# Patient Record
Sex: Male | Born: 1970 | Race: Black or African American | Hispanic: No | Marital: Married | Smoking: Never smoker
Health system: Southern US, Community
[De-identification: ages and names within clinical notes are randomized; demographics above are authoritative.]

## PROBLEM LIST (undated history)

## (undated) DIAGNOSIS — J45909 Unspecified asthma, uncomplicated: Secondary | ICD-10-CM

---

## 2019-07-08 ENCOUNTER — Emergency Department

## 2019-07-08 ENCOUNTER — Other Ambulatory Visit: Payer: Self-pay

## 2019-07-08 ENCOUNTER — Encounter: Payer: Self-pay | Admitting: Emergency Medicine

## 2019-07-08 ENCOUNTER — Emergency Department
Admission: EM | Admit: 2019-07-08 | Discharge: 2019-07-08 | Disposition: A | Discharge status: Home / Self Care | Attending: Emergency Medicine | Admitting: Emergency Medicine

## 2019-07-08 ENCOUNTER — Other Ambulatory Visit: Payer: Self-pay | Admitting: Physician Assistant

## 2019-07-08 DIAGNOSIS — B9789 Other viral agents as the cause of diseases classified elsewhere: Secondary | ICD-10-CM | POA: Insufficient documentation

## 2019-07-08 DIAGNOSIS — R05 Cough: Secondary | ICD-10-CM

## 2019-07-08 DIAGNOSIS — J45909 Unspecified asthma, uncomplicated: Secondary | ICD-10-CM | POA: Insufficient documentation

## 2019-07-08 DIAGNOSIS — R059 Cough, unspecified: Secondary | ICD-10-CM

## 2019-07-08 DIAGNOSIS — J069 Acute upper respiratory infection, unspecified: Secondary | ICD-10-CM | POA: Insufficient documentation

## 2019-07-08 HISTORY — DX: Unspecified asthma, uncomplicated: J45.909

## 2019-07-08 MED ORDER — PSEUDOEPH-BROMPHEN-DM 30-2-10 MG/5ML PO SYRP: 5.0000 mL | ORAL_SOLUTION | Freq: Four times a day (QID) | ORAL | 0 refills | Status: DC | PRN

## 2019-07-08 NOTE — ED Provider Notes (Signed)
[facility] Emergency Department Provider Note   ____________________________________________   First MD Initiated Contact with Patient 07/08/19 [phone]     (approximate)  I have reviewed the triage vital signs and the nursing notes.   HISTORY  Chief Complaint Cough    HPI Gerald Butler. is a 48 y.o. male patient complain productive cough for 1 week.  Patient state 25 June 2019 patient was at a [employer] reunion.  Patient state everyone practice social distancing and wall mass.  Patient state on July 5 fifth 1 advertisement test positive for COVID-19.  Patient state in the past week he has had mild dyspnea and productive cough.  Sputum is clear.  Patient was not too concerned because he has a history of asthma.  Patient took the advice of his wife and went [facility] and had a COVID test done 4 days ago.  Results are still pending.  Patient denies nausea, vomiting, diarrhea.  Patient denies fatigue but states mild body aches.  Patient rates his pain as a 3/10.  Patient described the pain as "achy".  No palliative measure for complaint.     Past Medical History:  Diagnosis Date  . Asthma     There are no active problems to display for this patient.   History reviewed. No pertinent surgical history.  Prior to Admission medications   Medication Sig Start Date End Date Taking? Authorizing Provider  brompheniramine-pseudoephedrine-DM 30-2-10 MG/5ML syrup Take 5 mLs by mouth 4 (four) times daily as needed. 07/08/19   Sable Feil, PA-C    Allergies Patient has no known allergies.  No family history on file.  Social History Social History   Tobacco Use  . Smoking status: Never Smoker  . Smokeless tobacco: Never Used  Substance Use Topics  . Alcohol use: Not Currently  . Drug use: Not Currently    Review of Systems Constitutional: No fever/chills Eyes: No visual changes. ENT: No sore throat. Cardiovascular: Denies chest pain.  Respiratory: Mild shortness of breath and productive cough.  Gastrointestinal: No abdominal pain.  No nausea, no vomiting.  No diarrhea.  No constipation. Genitourinary: Negative for dysuria. Musculoskeletal: Negative for back pain. Skin: Negative for rash. Neurological: Negative for headaches, focal weakness or numbness.   ____________________________________________   PHYSICAL EXAM:  VITAL SIGNS: ED Triage Vitals  Enc Vitals Group     BP 07/08/19 0859 133/66     Pulse Rate 07/08/19 0859 85     Resp 07/08/19 0859 16     Temp 07/08/19 0859 98.3 F (36.8 C)     Temp Source 07/08/19 0859 Oral     SpO2 07/08/19 0859 97 %     Weight 07/08/19 0854 265 lb (120.2 kg)     Height 07/08/19 0854 5\' 8"  (1.727 m)     Head Circumference --      Peak Flow --      Pain Score 07/08/19 0854 3     Pain Loc --      Pain Edu? --      Excl. in [place]? --    Constitutional: Alert and oriented. Well appearing and in no acute distress. Nose: No congestion/rhinnorhea. Mouth/Throat: Mucous membranes are moist.  Oropharynx non-erythematous. Neck: No stridor.  Cardiovascular: Normal rate, regular rhythm. Grossly normal heart sounds.  Good peripheral circulation. Respiratory: Normal respiratory effort.  No retractions. Lungs CTAB. Gastrointestinal: Soft and nontender. No distention. No abdominal bruits. No CVA tenderness. Musculoskeletal: No lower extremity tenderness nor edema.  No joint effusions. Neurologic:  Normal speech and language. No gross focal neurologic deficits are appreciated. No gait instability. Skin:  Skin is warm, dry and intact. No rash noted. Psychiatric: Mood and affect are normal. Speech and behavior are normal.  ____________________________________________   LABS (all labs ordered are listed, but only abnormal results are displayed)  Labs Reviewed - No data to display ____________________________________________  EKG   ____________________________________________   RADIOLOGY  ED MD interpretation:    Official radiology report(s): Dg Chest Port 1 View  Result Date: 07/08/2019 CLINICAL DATA:  Non smoker, hx of asthma, non productive cough. No feverPt to ED via POV c/o cough. Pt states that he was tested for COVID but the results are not back yet. EXAM: PORTABLE CHEST 1 VIEW COMPARISON:  None. FINDINGS: Cardiac silhouette is normal in size. No mediastinal or hilar masses. No evidence of adenopathy. Lungs are clear.  No pleural effusion or pneumothorax. Skeletal structures are grossly intact. IMPRESSION: No active disease. Electronically Signed   By: Amie Portlandavid  Ormond M.D.   On: 07/08/2019 09:49    ____________________________________________   PROCEDURES  Procedure(s) performed (including Critical Care):  Procedures   ____________________________________________   INITIAL IMPRESSION / ASSESSMENT AND PLAN / ED COURSE  As part of my medical decision making, I reviewed the following data within the electronic MEDICAL RECORD NUMBER         [name] was evaluated in Emergency Department on 07/08/2019 for the symptoms described in the history of present illness. He was evaluated in the context of the global COVID-19 pandemic, which necessitated consideration that the patient might be at risk for infection with the SARS-CoV-2 virus that causes COVID-19. Institutional protocols and algorithms that pertain to the evaluation of patients at risk for COVID-19 are in a state of rapid change based on information released by regulatory bodies including the CDC and federal and state organizations. These policies and algorithms were followed during the patient's care in the ED.   Patient presented with productive cough and positive exposure to COVID-19.  Patient is pending results from test done 4 days ago [facility].  Patient feels exam is grossly unremarkable except for cough.  Patient was further evaluated with a chest x-ray with no acute findings.   Patient given discharge care instruction advised to continue quarantine pending test results.  Take medication as directed.  Return to ED if condition worsens.     ____________________________________________   FINAL CLINICAL IMPRESSION(S) / ED DIAGNOSES  Final diagnoses:  Viral URI with cough     ED Discharge Orders         Ordered    brompheniramine-pseudoephedrine-DM 30-2-10 MG/5ML syrup  4 times daily PRN     07/08/19 0959           Note:  This document was prepared using Dragon voice recognition software and may include unintentional dictation errors.    Joni ReiningSmith, Londyn Hotard K, PA-C 07/08/19 1002    Sharyn CreamerQuale, Mark, MD 07/08/19 1550

## 2019-07-08 NOTE — Discharge Instructions (Addendum)
Advised to continue quarantine pending results of COVID-19 test.

## 2019-07-08 NOTE — ED Triage Notes (Signed)
Pt to ED via POV c/o cough. Pt states that he was tested for COVID but the results are not back yet. Pt is in NAD.

## 2020-09-16 NOTE — Progress Notes (Signed)
New Patient Note  RE: Gerald Butler. MRN: [id number] DOB: 1971-02-23 Date of Office Visit: 09/17/2020  Referring provider: Orland Jarred* Primary care provider: [facility]  Chief Complaint: Allergic Rhinitis  and Asthma  History of Present Illness: I had the pleasure of seeing Gerald Butler for initial evaluation at the [facility] on 09/17/2020. He is a 49 y.o. male, who is referred here by [facility] for the evaluation of allergic rhinitis and asthma.  Rhinitis:  He reports symptoms of itchy/watery eyes, scratchy throat, itchy skin, sneezing, nasal congestion, rhinorrhea. Symptoms have been going on for 25 years. The symptoms are present all year around with worsening in spring. Other triggers include exposure to dust and cat. Anosmia: diminished sense of smell. Headache: yes. He has used Zyrtec, azelastine, Flonase, Singulair with some improvement in symptoms. Sinus infections: no. Previous work up includes: bloodwork was positive to cat, dog, grass, cockroach, mold, trees, ragweed, weed Previous ENT evaluation: no. Previous sinus imaging: no. History of nasal polyps: no. Last eye exam: 2021 - has glaucoma. History of reflux: yes and takes omeprazole.  Asthma:  ACT score 17.  He reports symptoms of chest tightness, shortness of breath, wheezing for 18 years. Current medications include Asmanex 1 puff QHS, olodaterol 2 puffs in AM and albuterol prn which help. He reports using aerochamber with inhalers. He tried the following inhalers: none. Main triggers are allergies. In the last month, frequency of symptoms: daily. Frequency of nocturnal symptoms: 0x/month. Frequency of SABA use: daily for the past 1+ year.  Interference with physical activity: no. Sleep is undisturbed.  Patient wears his CPAP machine at night.   In the last 12 months, emergency room visits/urgent care visits/doctor office visits or hospitalizations due to  respiratory issues: 0. In the last 12 months, oral steroids courses: not this year. Lifetime history of hospitalization for respiratory issues: no. Prior intubations: no. History of pneumonia: no. He was evaluated by allergist in the past. Smoking exposure: no. Up to date with flu vaccine: yes. Up to date with pneumonia vaccine: yes. Up to date with COVID-19 vaccine: yes.     Assessment and Plan: Gerald Butler is a 49 y.o. male with: Other allergic rhinitis Perennial rhinoconjunctivitis symptoms for 25 years with worsening in the spring.  Tried Zyrtec, azelastine, Flonase, Singulair with some benefit. Immunocap in the past was positive to cat, dog, grass, cockroach, mold, trees, ragweed, weed.  No prior ENT evaluation.  Takes PPI for reflux.  Diagnosed recently with glaucoma.  Today's skin testing showed: Positive to grass, weed, ragweed, trees, mold, dust mites, cat, dog, cockroach. Results given.  Start environmental control measures as below. Start Singulair (montelukast) 10mg  daily at night. Cautioned that in some children/adults can experience behavioral changes including hyperactivity, agitation, depression, sleep disturbances and suicidal ideations. These side effects are rare, but if you notice them you should notify me and discontinue Singulair (montelukast).  May use over the counter antihistamines such as Zyrtec (cetirizine), Claritin (loratadine), Allegra (fexofenadine), or Xyzal (levocetirizine) daily as needed.  Had a detailed discussion with patient/family that clinical history is suggestive of allergic rhinitis, and may benefit from allergy immunotherapy (AIT). Discussed in detail regarding the dosing, schedule, side effects (mild to moderate local allergic reaction and rarely systemic allergic reactions including anaphylaxis), and benefits (significant improvement in nasal symptoms, seasonal flares of asthma) of immunotherapy with the patient. There is significant time commitment involved  with allergy shots, which includes weekly  immunotherapy injections for first 9-12 months and then biweekly to monthly injections for 3-5 years.   Read about allergy injections - will start once asthma is more stable.  Declines additional nasal sprays/eyedrops at this time.  Allergic conjunctivitis of both eyes  See assessment and plan as above for allergic rhinitis.  Not well controlled asthma without complication Diagnosed with asthma 18 years ago and had positive methacholine challenge test in 2016. Currently using Asmanex 220mcg 1 puff at night and olodaterol 2 puffs in AM but still needing albuterol daily. Wears CPAP at night. Triggers include allergies.  Today's ACT score was 17.  Today's spirometry showed: possible restrictive disease with no improvement in FEV1 post bronchodilator treatment. Patient clinically feeling improved. . Daily controller medication(s): Start Breo 200mcg 1 puff once a day. Sample given. Demonstrated proper use. Hopefully the once a day dosing will help with compliance. o Stop Asmanex inhaler.  . May use albuterol rescue inhaler 2 puffs every 4 to 6 hours as needed for shortness of breath, chest tightness, coughing, and wheezing. May use albuterol rescue inhaler 2 puffs 5 to 15 minutes prior to strenuous physical activities. Monitor frequency of use.  . Repeat spirometry at next visit.  Return in about 2 months (around 11/17/2020).  Meds ordered this encounter  Medications  . montelukast (SINGULAIR) 10 MG tablet    Sig: Take 1 tablet (10 mg total) by mouth at bedtime.    Dispense:  30 tablet    Refill:  5  . fluticasone furoate-vilanterol (BREO ELLIPTA) 200-25 MCG/INH AEPB    Sig: Inhale 1 puff into the lungs daily. Rinse mouth after each use.    Dispense:  60 each    Refill:  2   Other allergy screening: Food allergy: no Medication allergy: no Hymenoptera allergy: no Urticaria: no Eczema:no History of recurrent infections suggestive of  immunodeficency: no  Diagnostics: Spirometry:  Tracings reviewed. His effort: Good reproducible efforts. FVC: 3.14L FEV1: 2.34L, 73% predicted FEV1/FVC ratio: 75% Interpretation: Spirometry consistent with possible restrictive disease with no improvement in FEV1 post bronchodilator treatment. Patient clinically feeling improved though. Please see scanned spirometry results for details.  Skin Testing: Environmental allergy panel. Positive to grass, weed, ragweed, trees, mold, dust mites, cat, dog, cockroach. Results discussed with patient/family.  Airborne Adult Perc - 09/17/20 1413    Time Antigen Placed 1413    Allergen Manufacturer Waynette ButteryGreer    Location Back    Number of Test 59    Panel 1 Select    1. Control-Buffer 50% Glycerol Negative    2. Control-Histamine 1 mg/ml 2+    3. Albumin saline Negative    4. Bahia Negative    5. French Southern Territ[place] 4+    6. Johnson 4+    7. Kentucky Blue 4+    8. Meadow Fescue 2+    9. Perennial Rye 4+    10. Sweet Vernal 4+    11. Timothy 3+    12. Cocklebur 2+    13. Burweed Marshelder 2+    14. Ragweed, short 2+    15. Ragweed, Giant 2+    16. Plantain,  English 2+    17. Lamb's Quarters 2+    18. Sheep Sorrell 2+    19. Rough Pigweed 2+    20. Marsh Elder, Rough 2+    21. Mugwort, Common 3+    22. Ash mix 2+    23. Birch mix 2+    24. Beech American 2+    25. Box,  Elder 2+    26. Cedar, red Negative    27. Cottonwood, Eastern 2+    28. Elm mix 2+    29. Hickory 2+    30. Maple mix 2+    31. Oak, Guinea-Bissau mix Negative    32. Pecan Pollen 3+    33. Pine mix Negative    34. Sycamore Eastern 2+    35. Walnut, Black Pollen 4+    36. Alternaria alternata 2+    37. Cladosporium Herbarum Negative    38. Aspergillus mix Negative    39. Penicillium mix Negative    40. Bipolaris sorokiniana (Helminthosporium) 3+    41. Drechslera spicifera (Curvularia) 2+    42. Mucor plumbeus Negative    43. Fusarium moniliforme 2+    44. Aureobasidium  pullulans (pullulara) Negative    45. Rhizopus oryzae Negative    46. Botrytis cinera 2+    47. Epicoccum nigrum 2+    48. Phoma betae 2+    49. Candida Albicans Negative    50. Trichophyton mentagrophytes Negative    51. Mite, D Farinae  5,000 AU/ml 2+    52. Mite, D Pteronyssinus  5,000 AU/ml 2+    53. Cat Hair 10,000 BAU/ml 3+    54.  Dog Epithelia Negative    55. Mixed Feathers Negative    56. Horse Epithelia Negative    57. Cockroach, German Negative    58. Mouse Negative    59. Tobacco Leaf Negative          Intradermal - 09/17/20 1459    Time Antigen Placed 1459    Allergen Manufacturer Waynette Buttery    Location Arm    Number of Test 4    Intradermal Select    Control Negative    Mold 2 2+    Dog 2+    Cockroach 2+           Past Medical History: Patient Active Problem List   Diagnosis Date Noted  . Other allergic rhinitis 09/17/2020  . Not well controlled asthma without complication 09/17/2020  . Allergic conjunctivitis of both eyes 09/17/2020   Past Medical History:  Diagnosis Date  . Asthma    Past Surgical History: History reviewed. No pertinent surgical history. Medication List:  Current Outpatient Medications  Medication Sig Dispense Refill  . albuterol (VENTOLIN HFA) 108 (90 Base) MCG/ACT inhaler Inhale 2 puffs into the lungs every 6 (six) hours as needed for wheezing or shortness of breath.    . Azelastine HCl 137 MCG/SPRAY SOLN Place 2 sprays into the nose daily.    . busPIRone (BUSPAR) 15 MG tablet Take 7.5 mg by mouth daily.    . cetirizine (ZYRTEC) 10 MG tablet Take 10 mg by mouth daily.    Marland Kitchen escitalopram (LEXAPRO) 20 MG tablet Take 20 mg by mouth daily.    . Olodaterol HCl 2.5 MCG/ACT AERS Inhale 2 puffs into the lungs daily.    Marland Kitchen omeprazole (PRILOSEC) 40 MG capsule Take 40 mg by mouth in the morning and at bedtime.    . fluticasone furoate-vilanterol (BREO ELLIPTA) 200-25 MCG/INH AEPB Inhale 1 puff into the lungs daily. Rinse mouth after each use.  60 each 2  . montelukast (SINGULAIR) 10 MG tablet Take 1 tablet (10 mg total) by mouth at bedtime. 30 tablet 5   No current facility-administered medications for this visit.   Allergies: No Known Allergies Social History: Social History   Socioeconomic History  . Marital status: Married  Spouse name: Not on file  . Number of children: Not on file  . Years of education: Not on file  . Highest education level: Not on file  Occupational History  . Not on file  Tobacco Use  . Smoking status: Never Smoker  . Smokeless tobacco: Never Used  Vaping Use  . Vaping Use: Never used  Substance and Sexual Activity  . Alcohol use: Not Currently  . Drug use: Not Currently  . Sexual activity: Not on file  Other Topics Concern  . Not on file  Social History Narrative  . Not on file   Social Determinants of Health   Financial Resource Strain:   . Difficulty of Paying Living Expenses: Not on file  Food Insecurity:   . Worried About Programme researcher, broadcasting/film/video in the Last Year: Not on file  . Ran Out of Food in the Last Year: Not on file  Transportation Needs:   . Lack of Transportation (Medical): Not on file  . Lack of Transportation (Non-Medical): Not on file  Physical Activity:   . Days of Exercise per Week: Not on file  . Minutes of Exercise per Session: Not on file  Stress:   . Feeling of Stress : Not on file  Social Connections:   . Frequency of Communication with Friends and Family: Not on file  . Frequency of Social Gatherings with Friends and Family: Not on file  . Attends Religious Services: Not on file  . Active Member of Clubs or Organizations: Not on file  . Attends Banker Meetings: Not on file  . Marital Status: Not on file   Lives in a house. Smoking: denies Occupation: Research officer, trade union HistorySurveyor, minerals in the house: no Carpet in the family room: no Carpet in the bedroom: no Heating: heat pump Cooling: heat pump Pet:  no  Family History: Family History  Problem Relation Age of Onset  . Eczema Sister    Review of Systems  Constitutional: Negative for appetite change, chills, fever and unexpected weight change.  HENT: Negative for congestion and rhinorrhea.   Eyes: Negative for itching.  Respiratory: Positive for cough, chest tightness, shortness of breath and wheezing.   Cardiovascular: Negative for chest pain.  Gastrointestinal: Negative for abdominal pain.  Genitourinary: Negative for difficulty urinating.  Skin: Negative for rash.  Allergic/Immunologic: Positive for environmental allergies.  Neurological: Negative for headaches.   Objective: BP 124/86 (BP Location: Left Arm, Patient Position: Sitting, Cuff Size: Large)   Pulse 83   Temp 97.8 F (36.6 C) (Temporal)   Resp 18   Ht 5\' 8"  (1.727 m)   Wt 290 lb 12.8 oz (131.9 kg)   SpO2 97%   BMI 44.22 kg/m  Body mass index is 44.22 kg/m. Physical Exam Vitals and nursing note reviewed.  Constitutional:      Appearance: Normal appearance. He is well-developed.  HENT:     Head: Normocephalic and atraumatic.     Right Ear: External ear normal.     Left Ear: External ear normal.     Nose: Nose normal.     Mouth/Throat:     Mouth: Mucous membranes are moist.     Pharynx: Oropharynx is clear.  Eyes:     Conjunctiva/sclera: Conjunctivae normal.  Cardiovascular:     Rate and Rhythm: Normal rate and regular rhythm.     Heart sounds: Normal heart sounds. No murmur heard.  No friction rub. No gallop.   Pulmonary:  Effort: Pulmonary effort is normal.     Breath sounds: Normal breath sounds. No wheezing, rhonchi or rales.  Abdominal:     Palpations: Abdomen is soft.  Musculoskeletal:     Cervical back: Neck supple.  Skin:    General: Skin is warm.     Findings: No rash.  Neurological:     Mental Status: He is alert and oriented to person, place, and time.  Psychiatric:        Behavior: Behavior normal.    The plan was reviewed  with the patient/family, and all questions/concerned were addressed.  It was my pleasure to see Gerald Butler today and participate in his care. Please feel free to contact me with any questions or concerns.  Sincerely,  Wyline Mood, DO Allergy & Immunology  [facility] office: [phone] [facility] office: [phone] [place] office: [phone]

## 2020-09-17 ENCOUNTER — Other Ambulatory Visit: Payer: Self-pay

## 2020-09-17 ENCOUNTER — Encounter: Payer: Self-pay | Admitting: Allergy

## 2020-09-17 ENCOUNTER — Ambulatory Visit (INDEPENDENT_AMBULATORY_CARE_PROVIDER_SITE_OTHER): Payer: No Typology Code available for payment source | Admitting: Allergy

## 2020-09-17 VITALS — BP 124/86 | HR 83 | Temp 97.8°F | Resp 18 | Ht 68.0 in | Wt 290.8 lb

## 2020-09-17 DIAGNOSIS — J45909 Unspecified asthma, uncomplicated: Secondary | ICD-10-CM | POA: Diagnosis not present

## 2020-09-17 DIAGNOSIS — H1013 Acute atopic conjunctivitis, bilateral: Secondary | ICD-10-CM

## 2020-09-17 DIAGNOSIS — J3089 Other allergic rhinitis: Secondary | ICD-10-CM

## 2020-09-17 MED ORDER — MONTELUKAST SODIUM 10 MG PO TABS: 10.0000 mg | ORAL_TABLET | Freq: Every day | ORAL | 5 refills | Status: DC

## 2020-09-17 MED ORDER — BREO ELLIPTA 200-25 MCG/INH IN AEPB: 1.0000 | INHALATION_SPRAY | Freq: Every day | RESPIRATORY_TRACT | 2 refills | Status: DC

## 2020-09-17 NOTE — Patient Instructions (Addendum)
Today's skin testing showed: Positive to grass, weed, ragweed, trees, mold, dust mites, cat, dog, cockroach. Results given.  Environmental allergies  Start environmental control measures as below. Start Singulair (montelukast) 10mg  daily at night. Cautioned that in some children/adults can experience behavioral changes including hyperactivity, agitation, depression, sleep disturbances and suicidal ideations. These side effects are rare, but if you notice them you should notify me and discontinue Singulair (montelukast).  May use over the counter antihistamines such as Zyrtec (cetirizine), Claritin (loratadine), Allegra (fexofenadine), or Xyzal (levocetirizine) daily as needed.  Had a detailed discussion with patient/family that clinical history is suggestive of allergic rhinitis, and may benefit from allergy immunotherapy (AIT). Discussed in detail regarding the dosing, schedule, side effects (mild to moderate local allergic reaction and rarely systemic allergic reactions including anaphylaxis), and benefits (significant improvement in nasal symptoms, seasonal flares of asthma) of immunotherapy with the patient. There is significant time commitment involved with allergy shots, which includes weekly immunotherapy injections for first 9-12 months and then biweekly to monthly injections for 3-5 years.   Read about allergy injections - will start once your asthma is more stable.  Asthma:  Daily controller medication(s): Start Breo 1 puff once a day. Sample given. Demonstrated proper use. o Stop mometasone inhaler.   May use albuterol rescue inhaler 2 puffs every 4 to 6 hours as needed for shortness of breath, chest tightness, coughing, and wheezing. May use albuterol rescue inhaler 2 puffs 5 to 15 minutes prior to strenuous physical activities. Monitor frequency of use.   Asthma control goals:  o Full participation in all desired activities (may need albuterol before activity) o Albuterol  use two times or less a week on average (not counting use with activity) o Cough interfering with sleep two times or less a month o Oral steroids no more than once a year o No hospitalizations  Follow up in 2 months or sooner if needed.   Reducing Pollen Exposure  Pollen seasons: trees (spring), grass (summer) and ragweed/weeds (fall).  Keep windows closed in your home and car to lower pollen exposure.   Install air conditioning in the bedroom and throughout the house if possible.   Avoid going out in dry windy days - especially early morning.  Pollen counts are highest between 5 - 10 AM and on dry, hot and windy days.   Save outside activities for late afternoon or after a heavy rain, when pollen levels are lower.   Avoid mowing of grass if you have grass pollen allergy.  Be aware that pollen can also be transported indoors on people and pets.   Dry your clothes in an automatic dryer rather than hanging them outside where they might collect pollen.   Rinse hair and eyes before bedtime.  Mold Control  Mold and fungi can grow on a variety of surfaces provided certain temperature and moisture conditions exist.   Outdoor molds grow on plants, decaying vegetation and soil. The major outdoor mold, Alternaria and Cladosporium, are found in very high numbers during hot and dry conditions. Generally, a late summer - fall peak is seen for common outdoor fungal spores. Rain will temporarily lower outdoor mold spore count, but counts rise rapidly when the rainy period ends.  The most important indoor molds are Aspergillus and Penicillium. Dark, humid and poorly ventilated basements are ideal sites for mold growth. The next most common sites of mold growth are the bathroom and the kitchen. Outdoor (Seasonal) Mold Control  Use air conditioning and keep  windows closed.  Avoid exposure to decaying vegetation.  Avoid leaf raking.  Avoid grain handling.  Consider wearing a face mask if  working in moldy areas.  Indoor (Perennial) Mold Control   Maintain humidity below 50%.  Get rid of mold growth on hard surfaces with water, detergent and, if necessary, 5% bleach (do not mix with other cleaners). Then dry the area completely. If mold covers an area more than 10 square feet, consider hiring an indoor environmental professional.  For clothing, washing with soap and water is best. If moldy items cannot be cleaned and dried, throw them away.  Remove sources e.g. contaminated carpets.  Repair and seal leaking roofs or pipes. Using dehumidifiers in damp basements may be helpful, but empty the water and clean units regularly to prevent mildew from forming. All rooms, especially basements, bathrooms and kitchens, require ventilation and cleaning to deter mold and mildew growth. Avoid carpeting on concrete or damp floors, and storing items in damp areas. Control of House Dust Mite Allergen  Dust mite allergens are a common trigger of allergy and asthma symptoms. While they can be found throughout the house, these microscopic creatures thrive in warm, humid environments such as bedding, upholstered furniture and carpeting.  Because so much time is spent in the bedroom, it is essential to reduce mite levels there.   Encase pillows, mattresses, and box springs in special allergen-proof fabric covers or airtight, zippered plastic covers.   Bedding should be washed weekly in hot water (130 F) and dried in a hot dryer. Allergen-proof covers are available for comforters and pillows that cant be regularly washed.   Wash the allergy-proof covers every few months. Minimize clutter in the bedroom. Keep pets out of the bedroom.   Keep humidity less than 50% by using a dehumidifier or air conditioning. You can buy a humidity measuring device called a hygrometer to monitor this.   If possible, replace carpets with hardwood, linoleum, or washable area rugs. If that's not possible, vacuum  frequently with a vacuum that has a HEPA filter.  Remove all upholstered furniture and non-washable window drapes from the bedroom.  Remove all non-washable stuffed toys from the bedroom.  Wash stuffed toys weekly. Pet Allergen Avoidance:  Contrary to popular opinion, there are no hypoallergenic breeds of dogs or cats. That is because people are not allergic to an animals hair, but to an allergen found in the animal's saliva, dander (dead skin flakes) or urine. Pet allergy symptoms typically occur within minutes. For some people, symptoms can build up and become most severe 8 to 12 hours after contact with the animal. People with severe allergies can experience reactions in public places if dander has been transported on the pet owners clothing.  Keeping an animal outdoors is only a partial solution, since homes with pets in the yard still have higher concentrations of animal allergens.  Before getting a pet, ask your allergist to determine if you are allergic to animals. If your pet is already considered part of your family, try to minimize contact and keep the pet out of the bedroom and other rooms where you spend a great deal of time.  As with dust mites, vacuum carpets often or replace carpet with a hardwood floor, tile or linoleum.  High-efficiency particulate air (HEPA) cleaners can reduce allergen levels over time.  While dander and saliva are the source of cat and dog allergens, urine is the source of allergens from rabbits, hamsters, mice and [place] pigs; so ask  a non-allergic family member to clean the animals cage.  If you have a pet allergy, talk to your allergist about the potential for allergy immunotherapy (allergy shots). This strategy can often provide long-term relief. Cockroach Allergen Avoidance Cockroaches are often found in the homes of densely populated urban areas, schools or commercial buildings, but these creatures can lurk almost anywhere. This does not mean that  you have a dirty house or living area.  Block all areas where roaches can enter the home. This includes crevices, wall cracks and windows.   Cockroaches need water to survive, so fix and seal all leaky faucets and pipes. Have an exterminator go through the house when your family and pets are gone to eliminate any remaining roaches.  Keep food in lidded containers and put pet food dishes away after your pets are done eating. Vacuum and sweep the floor after meals, and take out garbage and recyclables. Use lidded garbage containers in the kitchen. Wash dishes immediately after use and clean under stoves, refrigerators or toasters where crumbs can accumulate. Wipe off the stove and other kitchen surfaces and cupboards regularly.

## 2020-09-17 NOTE — Assessment & Plan Note (Signed)
Perennial rhinoconjunctivitis symptoms for 25 years with worsening in the spring.  Tried Zyrtec, azelastine, Flonase, Singulair with some benefit. Immunocap in the past was positive to cat, dog, grass, cockroach, mold, trees, ragweed, weed.  No prior ENT evaluation.  Takes PPI for reflux.  Diagnosed recently with glaucoma.  Today's skin testing showed: Positive to grass, weed, ragweed, trees, mold, dust mites, cat, dog, cockroach. Results given.  Start environmental control measures as below. Start Singulair (montelukast) 10mg  daily at night. Cautioned that in some children/adults can experience behavioral changes including hyperactivity, agitation, depression, sleep disturbances and suicidal ideations. These side effects are rare, but if you notice them you should notify me and discontinue Singulair (montelukast).  May use over the counter antihistamines such as Zyrtec (cetirizine), Claritin (loratadine), Allegra (fexofenadine), or Xyzal (levocetirizine) daily as needed.  Had a detailed discussion with patient/family that clinical history is suggestive of allergic rhinitis, and may benefit from allergy immunotherapy (AIT). Discussed in detail regarding the dosing, schedule, side effects (mild to moderate local allergic reaction and rarely systemic allergic reactions including anaphylaxis), and benefits (significant improvement in nasal symptoms, seasonal flares of asthma) of immunotherapy with the patient. There is significant time commitment involved with allergy shots, which includes weekly immunotherapy injections for first 9-12 months and then biweekly to monthly injections for 3-5 years.   Read about allergy injections - will start once asthma is more stable.  Declines additional nasal sprays/eyedrops at this time.

## 2020-09-17 NOTE — Assessment & Plan Note (Signed)
   See assessment and plan as above for allergic rhinitis.  

## 2020-09-17 NOTE — Assessment & Plan Note (Signed)
Diagnosed with asthma 18 years ago and had positive methacholine challenge test in 2016. Currently using Asmanex 1 puff at night and olodaterol 2 puffs in AM but still needing albuterol daily. Wears CPAP at night. Triggers include allergies.  Today's ACT score was 17.  Today's spirometry showed: possible restrictive disease with no improvement in FEV1 post bronchodilator treatment. Patient clinically feeling improved. . Daily controller medication(s): Start Breo 1 puff once a day. Sample given. Demonstrated proper use. Hopefully the once a day dosing will help with compliance. o Stop Asmanex inhaler.  . May use albuterol rescue inhaler 2 puffs every 4 to 6 hours as needed for shortness of breath, chest tightness, coughing, and wheezing. May use albuterol rescue inhaler 2 puffs 5 to 15 minutes prior to strenuous physical activities. Monitor frequency of use.  . Repeat spirometry at next visit.

## 2020-11-11 DIAGNOSIS — H1010 Acute atopic conjunctivitis, unspecified eye: Secondary | ICD-10-CM | POA: Insufficient documentation

## 2020-11-11 DIAGNOSIS — J302 Other seasonal allergic rhinitis: Secondary | ICD-10-CM | POA: Insufficient documentation

## 2020-11-11 NOTE — Progress Notes (Signed)
Follow Up Note  RE: Gerald Butler. MRN: [id number] DOB: 1971-10-18 Date of Office Visit: 11/12/2020  Referring provider: [facility] Primary care provider: [facility]  Chief Complaint: Allergic Rhinitis  and Asthma  History of Present Illness: I had the pleasure of seeing Gerald Butler for a follow up visit at the [facility] on 11/12/2020. He is a 49 y.o. male, who is being followed for allergic rhinoconjunctivitis, asthma. His previous allergy office visit was on 09/17/2020 with Dr. Selena Batten. Today is a regular follow up visit.  Allergic rhino conjunctivitis: Doing much better since it's been cold outside. Taking Singulair and zyrtec 10 mg daily. Using saline nasal spray as he doesn't like the medicated nasal sprays. Uses eye drops only if needed. Interested in starting allergy injections.   Asthma: Act score 23. Currently on Breo 1 puff daily and doing much better. No albuterol use since the last visit. Denies any SOB, coughing, wheezing, chest tightness, nocturnal awakenings, ER/urgent care visits or prednisone use since the last visit. He didn't get the call about the prescription.   Assessment and Plan: Gerald Butler is a 49 y.o. male with: Moderate persistent asthma without complication Past history - Diagnosed with asthma 18 years ago and had positive methacholine challenge test in 2016. Wears CPAP at night. Triggers include allergies. Interim history - Doing much better with Breo.   Today's ACT score was 23.  Today's spirometry showed: possible restrictive disease. Improved from previous one.  . Daily controller medication(s): continue Breo 1 puff once a day. Sample given.  o If it's not covered let us know and will send in an alternative ICS/LABA combo inhaler.  . May use albuterol rescue inhaler 2 puffs every 4 to 6 hours as needed for shortness of breath, chest tightness, coughing, and wheezing. May use albuterol rescue inhaler  2 puffs 5 to 15 minutes prior to strenuous physical activities. Monitor frequency of use.   Seasonal and perennial allergic rhinoconjunctivitis Past history - Perennial rhinoconjunctivitis symptoms for 25 years with worsening in the spring.  Tried Zyrtec, azelastine, Flonase, Singulair with some benefit. Immunocap in the past was positive to cat, dog, grass, cockroach, mold, trees, ragweed, weed.  No prior ENT evaluation.  Takes PPI for reflux.  Diagnosed recently with glaucoma. 2021 skin testing showed: Positive to grass, weed, ragweed, trees, mold, dust mites, cat, dog, cockroach.  Interim history - Stable with below regimen.  Continue environmental control measures. Continue Singulair (montelukast) 10mg  daily at night.  May use over the counter antihistamines such as Zyrtec (cetirizine), Claritin (loratadine), Allegra (fexofenadine), or Xyzal (levocetirizine) daily as needed.  Had a detailed discussion with patient/family that clinical history is suggestive of allergic rhinitis, and may benefit from allergy immunotherapy (AIT). Discussed in detail regarding the dosing, schedule, side effects (mild to moderate local allergic reaction and rarely systemic allergic reactions including anaphylaxis), and benefits (significant improvement in nasal symptoms, seasonal flares of asthma) of immunotherapy with the patient. There is significant time commitment involved with allergy shots, which includes weekly immunotherapy injections for first 9-12 months and then biweekly to monthly injections for 3-5 years. Consent was signed.  Start allergy injections - Rx made based on latest skin testing and bloodwork results.  I have prescribed epinephrine injectable and demonstrated proper use. For mild symptoms you can take over the counter antihistamines such as Benadryl and monitor symptoms closely. If symptoms worsen or if you have severe symptoms including breathing issues, throat closure,  significant swelling,  whole body hives, severe diarrhea and vomiting, lightheadedness then inject epinephrine and seek immediate medical care afterwards.  Emergency action plan given.   Return in about 4 months (around 03/12/2021).  Meds ordered this encounter  Medications  . fluticasone furoate-vilanterol (BREO ELLIPTA) 200-25 MCG/INH AEPB    Sig: Inhale 1 puff into the lungs daily. Rinse mouth after each use.    Dispense:  60 each    Refill:  5  . EPINEPHrine 0.3 mg/0.3 mL IJ SOAJ injection    Sig: Inject 0.3 mg into the muscle as needed.    Dispense:  1 each    Refill:  2   Diagnostics: Spirometry:  Tracings reviewed. His effort: Good reproducible efforts. FVC: 3.08 L FEV1: 2.46 L, 77% predicted FEV1/FVC ratio: 80% Interpretation: Spirometry consistent with possible restrictive disease.  Improvement from previous one. Please see scanned spirometry results for details.  Medication List:  Current Outpatient Medications  Medication Sig Dispense Refill  . albuterol (VENTOLIN HFA) 108 (90 Base) MCG/ACT inhaler Inhale 2 puffs into the lungs every 6 (six) hours as needed for wheezing or shortness of breath.    . Azelastine HCl 137 MCG/SPRAY SOLN Place 2 sprays into the nose daily.    . busPIRone (BUSPAR) 15 MG tablet Take 7.5 mg by mouth daily.    . cetirizine (ZYRTEC) 10 MG tablet Take 10 mg by mouth daily.    Marland Kitchen escitalopram (LEXAPRO) 20 MG tablet Take 20 mg by mouth daily.    . montelukast (SINGULAIR) 10 MG tablet Take 1 tablet (10 mg total) by mouth at bedtime. 30 tablet 5  . Olodaterol HCl 2.5 MCG/ACT AERS Inhale 2 puffs into the lungs daily.    Marland Kitchen omeprazole (PRILOSEC) 40 MG capsule Take 40 mg by mouth in the morning and at bedtime.    Marland Kitchen EPINEPHrine 0.3 mg/0.3 mL IJ SOAJ injection Inject 0.3 mg into the muscle as needed. 1 each 2  . fluticasone furoate-vilanterol (BREO ELLIPTA) 200-25 MCG/INH AEPB Inhale 1 puff into the lungs daily. Rinse mouth after each use. 60 each 5   No current  facility-administered medications for this visit.   Allergies: No Known Allergies I reviewed his past medical history, social history, family history, and environmental history and no significant changes have been reported from his previous visit.  Review of Systems  Constitutional: Negative for appetite change, chills, fever and unexpected weight change.  HENT: Negative for congestion and rhinorrhea.   Eyes: Negative for itching.  Respiratory: Negative for cough, chest tightness, shortness of breath and wheezing.   Cardiovascular: Negative for chest pain.  Gastrointestinal: Negative for abdominal pain.  Genitourinary: Negative for difficulty urinating.  Skin: Negative for rash.  Allergic/Immunologic: Positive for environmental allergies.  Neurological: Negative for headaches.   Objective: BP 124/82   Pulse 76   Temp 98.4 F (36.9 C) (Temporal)   Resp 16   SpO2 97%  There is no height or weight on file to calculate BMI. Physical Exam Vitals and nursing note reviewed.  Constitutional:      Appearance: Normal appearance. He is well-developed.  HENT:     Head: Normocephalic and atraumatic.     Right Ear: Tympanic membrane and external ear normal.     Left Ear: Tympanic membrane and external ear normal.     Nose: Nose normal.     Mouth/Throat:     Mouth: Mucous membranes are moist.     Pharynx: Oropharynx is clear.  Eyes:  Conjunctiva/sclera: Conjunctivae normal.  Cardiovascular:     Rate and Rhythm: Normal rate and regular rhythm.     Heart sounds: Normal heart sounds. No murmur heard.  No friction rub. No gallop.   Pulmonary:     Effort: Pulmonary effort is normal.     Breath sounds: Normal breath sounds. No wheezing, rhonchi or rales.  Musculoskeletal:     Cervical back: Neck supple.  Skin:    General: Skin is warm.     Findings: No rash.  Neurological:     Mental Status: He is alert and oriented to person, place, and time.  Psychiatric:        Behavior:  Behavior normal.    Previous notes and tests were reviewed. The plan was reviewed with the patient/family, and all questions/concerned were addressed.  It was my pleasure to see Quadir today and participate in his care. Please feel free to contact me with any questions or concerns.  Sincerely,  Wyline Mood, DO Allergy & Immunology  [facility] office: [phone] [facility] office: [phone]

## 2020-11-12 ENCOUNTER — Ambulatory Visit (INDEPENDENT_AMBULATORY_CARE_PROVIDER_SITE_OTHER): Payer: No Typology Code available for payment source | Admitting: Allergy

## 2020-11-12 ENCOUNTER — Other Ambulatory Visit: Payer: Self-pay

## 2020-11-12 ENCOUNTER — Encounter: Payer: Self-pay | Admitting: Allergy

## 2020-11-12 VITALS — BP 124/82 | HR 76 | Temp 98.4°F | Resp 16

## 2020-11-12 DIAGNOSIS — J302 Other seasonal allergic rhinitis: Secondary | ICD-10-CM | POA: Diagnosis not present

## 2020-11-12 DIAGNOSIS — J3089 Other allergic rhinitis: Secondary | ICD-10-CM | POA: Diagnosis not present

## 2020-11-12 DIAGNOSIS — J4540 Moderate persistent asthma, uncomplicated: Secondary | ICD-10-CM | POA: Diagnosis not present

## 2020-11-12 DIAGNOSIS — H1010 Acute atopic conjunctivitis, unspecified eye: Secondary | ICD-10-CM | POA: Diagnosis not present

## 2020-11-12 MED ORDER — EPINEPHRINE 0.3 MG/0.3ML IJ SOAJ: 0.3000 mg | INTRAMUSCULAR | 2 refills | Status: AC | PRN

## 2020-11-12 MED ORDER — BREO ELLIPTA 200-25 MCG/INH IN AEPB: 1.0000 | INHALATION_SPRAY | Freq: Every day | RESPIRATORY_TRACT | 5 refills | Status: DC

## 2020-11-12 NOTE — Assessment & Plan Note (Signed)
Past history - Diagnosed with asthma 18 years ago and had positive methacholine challenge test in 2016. Wears CPAP at night. Triggers include allergies. Interim history - Doing much better with Breo.   Today's ACT score was 23.  Today's spirometry showed: possible restrictive disease. Improved from previous one.  . Daily controller medication(s): continue Breo 1 puff once a day. Sample given.  o If it's not covered let us know and will send in an alternative ICS/LABA combo inhaler.  . May use albuterol rescue inhaler 2 puffs every 4 to 6 hours as needed for shortness of breath, chest tightness, coughing, and wheezing. May use albuterol rescue inhaler 2 puffs 5 to 15 minutes prior to strenuous physical activities. Monitor frequency of use.

## 2020-11-12 NOTE — Patient Instructions (Addendum)
Environmental allergies  Past skin testing showed: Positive to grass, weed, ragweed, trees, mold, dust mites, cat, dog, cockroach.  Continue environmental control measures. Continue Singulair (montelukast)  daily at night.  May use over the counter antihistamines such as Zyrtec (cetirizine), Claritin (loratadine), Allegra (fexofenadine), or Xyzal (levocetirizine) daily as needed.  Had a detailed discussion with patient/family that clinical history is suggestive of allergic rhinitis, and may benefit from allergy immunotherapy (AIT). Discussed in detail regarding the dosing, schedule, side effects (mild to moderate local allergic reaction and rarely systemic allergic reactions including anaphylaxis), and benefits (significant improvement in nasal symptoms, seasonal flares of asthma) of immunotherapy with the patient. There is significant time commitment involved with allergy shots, which includes weekly immunotherapy injections for first 9-12 months and then biweekly to monthly injections for 3-5 years. Consent was signed.  Start allergy injections.  I have prescribed epinephrine injectable and demonstrated proper use. For mild symptoms you can take over the counter antihistamines such as Benadryl and monitor symptoms closely. If symptoms worsen or if you have severe symptoms including breathing issues, throat closure, significant swelling, whole body hives, severe diarrhea and vomiting, lightheadedness then inject epinephrine and seek immediate medical care afterwards.  Emergency action plan given.   Asthma: . Daily controller medication(s): continue Breo 1 puff once a day. Sample given.  o If it's not covered let us know and will send in an alternative. . May use albuterol rescue inhaler 2 puffs every 4 to 6 hours as needed for shortness of breath, chest tightness, coughing, and wheezing. May use albuterol rescue inhaler 2 puffs 5 to 15 minutes prior to strenuous physical activities.  Monitor frequency of use.  . Asthma control goals:  o Full participation in all desired activities (may need albuterol before activity) o Albuterol use two times or less a week on average (not counting use with activity) o Cough interfering with sleep two times or less a month o Oral steroids no more than once a year o No hospitalizations  Follow up in 4 months or sooner if needed.   Reducing Pollen Exposure . Pollen seasons: trees (spring), grass (summer) and ragweed/weeds (fall). Marland Kitchen Keep windows closed in your home and car to lower pollen exposure.  Lilian Kapur air conditioning in the bedroom and throughout the house if possible.  . Avoid going out in dry windy days - especially early morning. . Pollen counts are highest between 5 - 10 AM and on dry, hot and windy days.  . Save outside activities for late afternoon or after a heavy rain, when pollen levels are lower.  . Avoid mowing of grass if you have grass pollen allergy. Marland Kitchen Be aware that pollen can also be transported indoors on people and pets.  . Dry your clothes in an automatic dryer rather than hanging them outside where they might collect pollen.  . Rinse hair and eyes before bedtime.  Mold Control . Mold and fungi can grow on a variety of surfaces provided certain temperature and moisture conditions exist.  . Outdoor molds grow on plants, decaying vegetation and soil. The major outdoor mold, Alternaria and Cladosporium, are found in very high numbers during hot and dry conditions. Generally, a late summer - fall peak is seen for common outdoor fungal spores. Rain will temporarily lower outdoor mold spore count, but counts rise rapidly when the rainy period ends. . The most important indoor molds are Aspergillus and Penicillium. Dark, humid and poorly ventilated basements are ideal sites for mold  growth. The next most common sites of mold growth are the bathroom and the kitchen. Outdoor (Seasonal) Mold Control . Use air conditioning  and keep windows closed. . Avoid exposure to decaying vegetation. Marland Kitchen Avoid leaf raking. . Avoid grain handling. . Consider wearing a face mask if working in moldy areas.  Indoor (Perennial) Mold Control  . Maintain humidity below 50%. . Get rid of mold growth on hard surfaces with water, detergent and, if necessary, 5% bleach (do not mix with other cleaners). Then dry the area completely. If mold covers an area more than 10 square feet, consider hiring an indoor environmental professional. . For clothing, washing with soap and water is best. If moldy items cannot be cleaned and dried, throw them away. . Remove sources e.g. contaminated carpets. . Repair and seal leaking roofs or pipes. Using dehumidifiers in damp basements may be helpful, but empty the water and clean units regularly to prevent mildew from forming. All rooms, especially basements, bathrooms and kitchens, require ventilation and cleaning to deter mold and mildew growth. Avoid carpeting on concrete or damp floors, and storing items in damp areas. Control of House Dust Mite Allergen . Dust mite allergens are a common trigger of allergy and asthma symptoms. While they can be found throughout the house, these microscopic creatures thrive in warm, humid environments such as bedding, upholstered furniture and carpeting. . Because so much time is spent in the bedroom, it is essential to reduce mite levels there.  . Encase pillows, mattresses, and box springs in special allergen-proof fabric covers or airtight, zippered plastic covers.  . Bedding should be washed weekly in hot water (130 F) and dried in a hot dryer. Allergen-proof covers are available for comforters and pillows that can't be regularly washed.  Reyes Ivan the allergy-proof covers every few months. Minimize clutter in the bedroom. Keep pets out of the bedroom.  Marland Kitchen Keep humidity less than 50% by using a dehumidifier or air conditioning. You can buy a humidity measuring device called  a hygrometer to monitor this.  . If possible, replace carpets with hardwood, linoleum, or washable area rugs. If that's not possible, vacuum frequently with a vacuum that has a HEPA filter. . Remove all upholstered furniture and non-washable window drapes from the bedroom. . Remove all non-washable stuffed toys from the bedroom.  Wash stuffed toys weekly. Pet Allergen Avoidance: . Contrary to popular opinion, there are no "hypoallergenic" breeds of dogs or cats. That is because people are not allergic to an animal's hair, but to an allergen found in the animal's saliva, dander (dead skin flakes) or urine. Pet allergy symptoms typically occur within minutes. For some people, symptoms can build up and become most severe 8 to 12 hours after contact with the animal. People with severe allergies can experience reactions in public places if dander has been transported on the pet owners' clothing. Marland Kitchen Keeping an animal outdoors is only a partial solution, since homes with pets in the yard still have higher concentrations of animal allergens. . Before getting a pet, ask your allergist to determine if you are allergic to animals. If your pet is already considered part of your family, try to minimize contact and keep the pet out of the bedroom and other rooms where you spend a great deal of time. . As with dust mites, vacuum carpets often or replace carpet with a hardwood floor, tile or linoleum. . High-efficiency particulate air (HEPA) cleaners can reduce allergen levels over time. . While dander  and saliva are the source of cat and dog allergens, urine is the source of allergens from rabbits, hamsters, mice and [place]; so ask a non-allergic family member to clean the animal's cage. . If you have a pet allergy, talk to your allergist about the potential for allergy immunotherapy (allergy shots). This strategy can often provide long-term relief. Cockroach Allergen Avoidance Cockroaches are often found in the  homes of densely populated urban areas, schools or commercial buildings, but these creatures can lurk almost anywhere. This does not mean that you have a dirty house or living area. . Block all areas where roaches can enter the home. This includes crevices, wall cracks and windows.  . Cockroaches need water to survive, so fix and seal all leaky faucets and pipes. Have an exterminator go through the house when your family and pets are gone to eliminate any remaining roaches. Marland Kitchen. Keep food in lidded containers and put pet food dishes away after your pets are done eating. Vacuum and sweep the floor after meals, and take out garbage and recyclables. Use lidded garbage containers in the kitchen. Wash dishes immediately after use and clean under stoves, refrigerators or toasters where crumbs can accumulate. Wipe off the stove and other kitchen surfaces and cupboards regularly.   Today's skin testing showed: Positive to grass, weed, ragweed, trees, mold, dust mites, cat, dog, cockroach. Results given.  Environmental allergies  Start environmental control measures as below. Start Singulair (montelukast) 10mg  daily at night. Cautioned that in some children/adults can experience behavioral changes including hyperactivity, agitation, depression, sleep disturbances and suicidal ideations. These side effects are rare, but if you notice them you should notify me and discontinue Singulair (montelukast).  May use over the counter antihistamines such as Zyrtec (cetirizine), Claritin (loratadine), Allegra (fexofenadine), or Xyzal (levocetirizine) daily as needed.  Had a detailed discussion with patient/family that clinical history is suggestive of allergic rhinitis, and may benefit from allergy immunotherapy (AIT). Discussed in detail regarding the dosing, schedule, side effects (mild to moderate local allergic reaction and rarely systemic allergic reactions including anaphylaxis), and benefits (significant  improvement in nasal symptoms, seasonal flares of asthma) of immunotherapy with the patient. There is significant time commitment involved with allergy shots, which includes weekly immunotherapy injections for first 9-12 months and then biweekly to monthly injections for 3-5 years.   Read about allergy injections - will start once your asthma is more stable.  Asthma: . Daily controller medication(s): Start Breo 200mcg 1 puff once a day. Sample given. Demonstrated proper use. o Stop mometasone inhaler.  . May use albuterol rescue inhaler 2 puffs every 4 to 6 hours as needed for shortness of breath, chest tightness, coughing, and wheezing. May use albuterol rescue inhaler 2 puffs 5 to 15 minutes prior to strenuous physical activities. Monitor frequency of use.  . Asthma control goals:  o Full participation in all desired activities (may need albuterol before activity) o Albuterol use two times or less a week on average (not counting use with activity) o Cough interfering with sleep two times or less a month o Oral steroids no more than once a year o No hospitalizations  Follow up in 2 months or sooner if needed.   Reducing Pollen Exposure . Pollen seasons: trees (spring), grass (summer) and ragweed/weeds (fall). Marland Kitchen. Keep windows closed in your home and car to lower pollen exposure.  Lilian Kapur. Install air conditioning in the bedroom and throughout the house if possible.  . Avoid going out in dry windy days -  especially early morning. . Pollen counts are highest between 5 - 10 AM and on dry, hot and windy days.  . Save outside activities for late afternoon or after a heavy rain, when pollen levels are lower.  . Avoid mowing of grass if you have grass pollen allergy. Marland Kitchen Be aware that pollen can also be transported indoors on people and pets.  . Dry your clothes in an automatic dryer rather than hanging them outside where they might collect pollen.  . Rinse hair and eyes before bedtime.  Mold  Control . Mold and fungi can grow on a variety of surfaces provided certain temperature and moisture conditions exist.  . Outdoor molds grow on plants, decaying vegetation and soil. The major outdoor mold, Alternaria and Cladosporium, are found in very high numbers during hot and dry conditions. Generally, a late summer - fall peak is seen for common outdoor fungal spores. Rain will temporarily lower outdoor mold spore count, but counts rise rapidly when the rainy period ends. . The most important indoor molds are Aspergillus and Penicillium. Dark, humid and poorly ventilated basements are ideal sites for mold growth. The next most common sites of mold growth are the bathroom and the kitchen. Outdoor (Seasonal) Mold Control . Use air conditioning and keep windows closed. . Avoid exposure to decaying vegetation. Marland Kitchen Avoid leaf raking. . Avoid grain handling. . Consider wearing a face mask if working in moldy areas.  Indoor (Perennial) Mold Control  . Maintain humidity below 50%. . Get rid of mold growth on hard surfaces with water, detergent and, if necessary, 5% bleach (do not mix with other cleaners). Then dry the area completely. If mold covers an area more than 10 square feet, consider hiring an indoor environmental professional. . For clothing, washing with soap and water is best. If moldy items cannot be cleaned and dried, throw them away. . Remove sources e.g. contaminated carpets. . Repair and seal leaking roofs or pipes. Using dehumidifiers in damp basements may be helpful, but empty the water and clean units regularly to prevent mildew from forming. All rooms, especially basements, bathrooms and kitchens, require ventilation and cleaning to deter mold and mildew growth. Avoid carpeting on concrete or damp floors, and storing items in damp areas. Control of House Dust Mite Allergen . Dust mite allergens are a common trigger of allergy and asthma symptoms. While they can be found throughout the  house, these microscopic creatures thrive in warm, humid environments such as bedding, upholstered furniture and carpeting. . Because so much time is spent in the bedroom, it is essential to reduce mite levels there.  . Encase pillows, mattresses, and box springs in special allergen-proof fabric covers or airtight, zippered plastic covers.  . Bedding should be washed weekly in hot water (130 F) and dried in a hot dryer. Allergen-proof covers are available for comforters and pillows that can't be regularly washed.  Reyes Ivan the allergy-proof covers every few months. Minimize clutter in the bedroom. Keep pets out of the bedroom.  Marland Kitchen Keep humidity less than 50% by using a dehumidifier or air conditioning. You can buy a humidity measuring device called a hygrometer to monitor this.  . If possible, replace carpets with hardwood, linoleum, or washable area rugs. If that's not possible, vacuum frequently with a vacuum that has a HEPA filter. . Remove all upholstered furniture and non-washable window drapes from the bedroom. . Remove all non-washable stuffed toys from the bedroom.  Wash stuffed toys weekly. Pet Allergen Avoidance: .  Contrary to popular opinion, there are no "hypoallergenic" breeds of dogs or cats. That is because people are not allergic to an animal's hair, but to an allergen found in the animal's saliva, dander (dead skin flakes) or urine. Pet allergy symptoms typically occur within minutes. For some people, symptoms can build up and become most severe 8 to 12 hours after contact with the animal. People with severe allergies can experience reactions in public places if dander has been transported on the pet owners' clothing. Marland Kitchen Keeping an animal outdoors is only a partial solution, since homes with pets in the yard still have higher concentrations of animal allergens. . Before getting a pet, ask your allergist to determine if you are allergic to animals. If your pet is already considered part of  your family, try to minimize contact and keep the pet out of the bedroom and other rooms where you spend a great deal of time. . As with dust mites, vacuum carpets often or replace carpet with a hardwood floor, tile or linoleum. . High-efficiency particulate air (HEPA) cleaners can reduce allergen levels over time. . While dander and saliva are the source of cat and dog allergens, urine is the source of allergens from rabbits, hamsters, mice and [place] pigs; so ask a non-allergic family member to clean the animal's cage. . If you have a pet allergy, talk to your allergist about the potential for allergy immunotherapy (allergy shots). This strategy can often provide long-term relief. Cockroach Allergen Avoidance Cockroaches are often found in the homes of densely populated urban areas, schools or commercial buildings, but these creatures can lurk almost anywhere. This does not mean that you have a dirty house or living area. . Block all areas where roaches can enter the home. This includes crevices, wall cracks and windows.  . Cockroaches need water to survive, so fix and seal all leaky faucets and pipes. Have an exterminator go through the house when your family and pets are gone to eliminate any remaining roaches. Marland Kitchen Keep food in lidded containers and put pet food dishes away after your pets are done eating. Vacuum and sweep the floor after meals, and take out garbage and recyclables. Use lidded garbage containers in the kitchen. Wash dishes immediately after use and clean under stoves, refrigerators or toasters where crumbs can accumulate. Wipe off the stove and other kitchen surfaces and cupboards regularly.

## 2020-11-12 NOTE — Assessment & Plan Note (Addendum)
Past history - Perennial rhinoconjunctivitis symptoms for 25 years with worsening in the spring.  Tried Zyrtec, azelastine, Flonase, Singulair with some benefit. Immunocap in the past was positive to cat, dog, grass, cockroach, mold, trees, ragweed, weed.  No prior ENT evaluation.  Takes PPI for reflux.  Diagnosed recently with glaucoma. 2021 skin testing showed: Positive to grass, weed, ragweed, trees, mold, dust mites, cat, dog, cockroach.  Interim history - Stable with below regimen.  Continue environmental control measures. Continue Singulair (montelukast) 10mg  daily at night.  May use over the counter antihistamines such as Zyrtec (cetirizine), Claritin (loratadine), Allegra (fexofenadine), or Xyzal (levocetirizine) daily as needed.  Had a detailed discussion with patient/family that clinical history is suggestive of allergic rhinitis, and may benefit from allergy immunotherapy (AIT). Discussed in detail regarding the dosing, schedule, side effects (mild to moderate local allergic reaction and rarely systemic allergic reactions including anaphylaxis), and benefits (significant improvement in nasal symptoms, seasonal flares of asthma) of immunotherapy with the patient. There is significant time commitment involved with allergy shots, which includes weekly immunotherapy injections for first 9-12 months and then biweekly to monthly injections for 3-5 years. Consent was signed.  Start allergy injections - Rx made based on latest skin testing and bloodwork results.  I have prescribed epinephrine injectable and demonstrated proper use. For mild symptoms you can take over the counter antihistamines such as Benadryl and monitor symptoms closely. If symptoms worsen or if you have severe symptoms including breathing issues, throat closure, significant swelling, whole body hives, severe diarrhea and vomiting, lightheadedness then inject epinephrine and seek immediate medical care afterwards.  Emergency  action plan given.

## 2020-11-12 NOTE — Progress Notes (Signed)
VIALS EXP 11-12-21 

## 2020-11-13 DIAGNOSIS — J3089 Other allergic rhinitis: Secondary | ICD-10-CM

## 2020-11-14 DIAGNOSIS — J3081 Allergic rhinitis due to animal (cat) (dog) hair and dander: Secondary | ICD-10-CM | POA: Diagnosis not present

## 2020-12-03 ENCOUNTER — Ambulatory Visit (INDEPENDENT_AMBULATORY_CARE_PROVIDER_SITE_OTHER): Admitting: *Deleted

## 2020-12-03 ENCOUNTER — Other Ambulatory Visit: Payer: Self-pay

## 2020-12-03 DIAGNOSIS — J309 Allergic rhinitis, unspecified: Secondary | ICD-10-CM | POA: Diagnosis not present

## 2020-12-03 NOTE — Progress Notes (Signed)
Immunotherapy   Patient Details  Name: Gerald Butler. MRN: [id number] Date of Birth: [birth year]  12/03/2020  Gerald Butler. started injections for  G-RW-W-T-DM, M-C-D-CR Following schedule: A  Frequency:1 time per week Epi-Pen:Epi-Pen Available  Consent signed and patient instructions given. Patient started allergy injections today and received 0.68mL of G-RW-W-T-DM in the RUA and 0.61mL of M-C-D-CR in the LUA. Patient waited 30 minutes and did not experience any issues.    [name] 12/03/2020, 10:51 AM

## 2020-12-10 ENCOUNTER — Ambulatory Visit (INDEPENDENT_AMBULATORY_CARE_PROVIDER_SITE_OTHER)

## 2020-12-10 DIAGNOSIS — J309 Allergic rhinitis, unspecified: Secondary | ICD-10-CM | POA: Diagnosis not present

## 2020-12-17 ENCOUNTER — Ambulatory Visit (INDEPENDENT_AMBULATORY_CARE_PROVIDER_SITE_OTHER): Payer: No Typology Code available for payment source

## 2020-12-17 DIAGNOSIS — J309 Allergic rhinitis, unspecified: Secondary | ICD-10-CM | POA: Diagnosis not present

## 2020-12-24 ENCOUNTER — Ambulatory Visit (INDEPENDENT_AMBULATORY_CARE_PROVIDER_SITE_OTHER): Payer: No Typology Code available for payment source

## 2020-12-24 DIAGNOSIS — J309 Allergic rhinitis, unspecified: Secondary | ICD-10-CM | POA: Diagnosis not present

## 2020-12-31 ENCOUNTER — Ambulatory Visit (INDEPENDENT_AMBULATORY_CARE_PROVIDER_SITE_OTHER): Payer: No Typology Code available for payment source

## 2020-12-31 DIAGNOSIS — J309 Allergic rhinitis, unspecified: Secondary | ICD-10-CM | POA: Diagnosis not present

## 2021-01-07 ENCOUNTER — Ambulatory Visit (INDEPENDENT_AMBULATORY_CARE_PROVIDER_SITE_OTHER): Payer: No Typology Code available for payment source | Admitting: *Deleted

## 2021-01-07 DIAGNOSIS — J309 Allergic rhinitis, unspecified: Secondary | ICD-10-CM | POA: Diagnosis not present

## 2021-01-15 ENCOUNTER — Ambulatory Visit (INDEPENDENT_AMBULATORY_CARE_PROVIDER_SITE_OTHER): Payer: No Typology Code available for payment source

## 2021-01-15 DIAGNOSIS — J309 Allergic rhinitis, unspecified: Secondary | ICD-10-CM

## 2021-01-22 ENCOUNTER — Ambulatory Visit (INDEPENDENT_AMBULATORY_CARE_PROVIDER_SITE_OTHER): Payer: No Typology Code available for payment source

## 2021-01-22 DIAGNOSIS — J309 Allergic rhinitis, unspecified: Secondary | ICD-10-CM

## 2021-01-30 ENCOUNTER — Ambulatory Visit (INDEPENDENT_AMBULATORY_CARE_PROVIDER_SITE_OTHER): Payer: No Typology Code available for payment source | Admitting: *Deleted

## 2021-01-30 DIAGNOSIS — J309 Allergic rhinitis, unspecified: Secondary | ICD-10-CM

## 2021-02-06 ENCOUNTER — Ambulatory Visit (INDEPENDENT_AMBULATORY_CARE_PROVIDER_SITE_OTHER): Payer: No Typology Code available for payment source | Admitting: *Deleted

## 2021-02-06 DIAGNOSIS — J309 Allergic rhinitis, unspecified: Secondary | ICD-10-CM | POA: Diagnosis not present

## 2021-02-07 IMAGING — DX PORTABLE CHEST - 1 VIEW
1 series · 1 of 1 positions shown · non-contrast
Comparison: None.

CLINICAL DATA: Non smoker, hx of asthma, non productive cough. No
feverPt to ED via POV c/o cough. Pt states that he was tested for
COVID but the results are not back yet.

EXAM:
PORTABLE CHEST 1 VIEW

[chest ap]
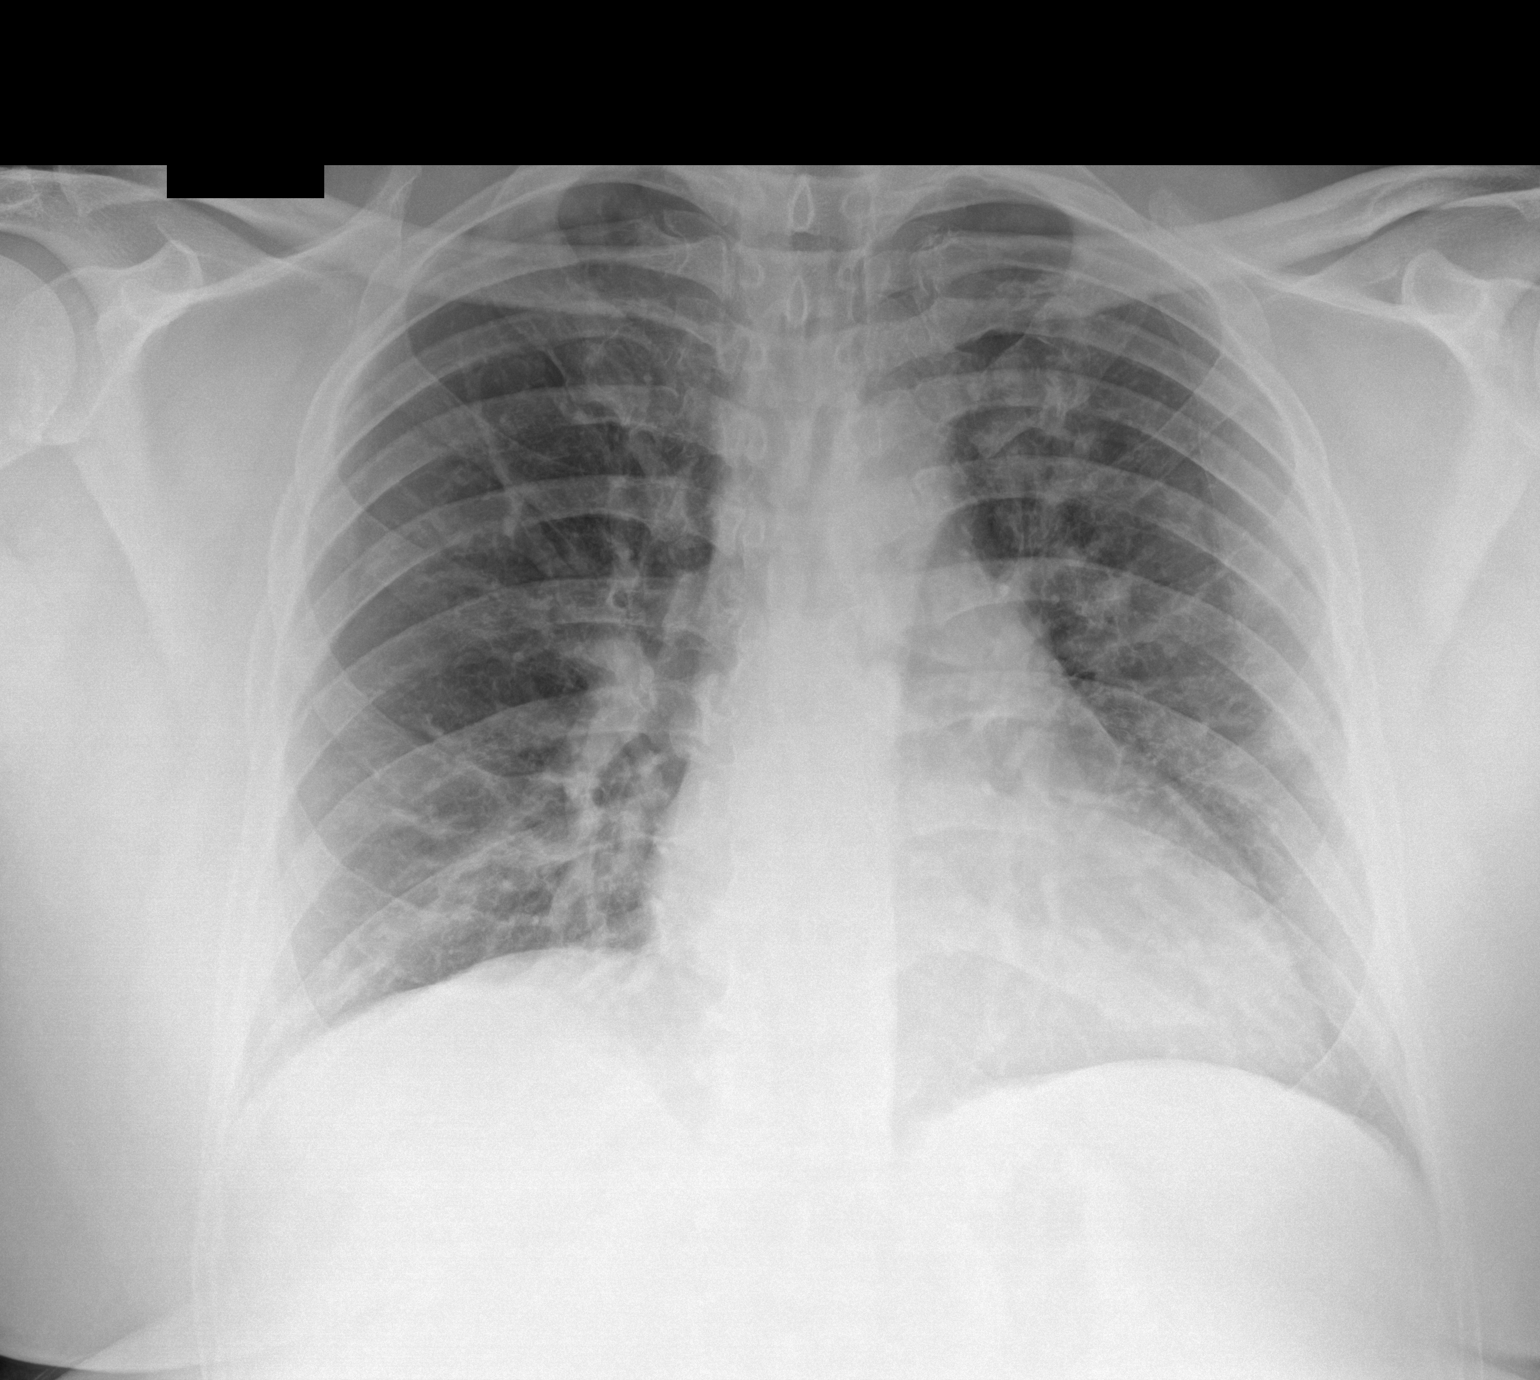

[1 of 1 positions shown; findings below may reference images not displayed]

FINDINGS: Cardiac silhouette is normal in size. No mediastinal or hilar
masses. No evidence of adenopathy.

Lungs are clear.  No pleural effusion or pneumothorax.

Skeletal structures are grossly intact.
IMPRESSION: No active disease.

## 2021-02-13 ENCOUNTER — Ambulatory Visit (INDEPENDENT_AMBULATORY_CARE_PROVIDER_SITE_OTHER): Payer: No Typology Code available for payment source | Admitting: *Deleted

## 2021-02-13 DIAGNOSIS — J309 Allergic rhinitis, unspecified: Secondary | ICD-10-CM | POA: Diagnosis not present

## 2021-03-03 ENCOUNTER — Ambulatory Visit (INDEPENDENT_AMBULATORY_CARE_PROVIDER_SITE_OTHER): Payer: No Typology Code available for payment source | Admitting: *Deleted

## 2021-03-03 DIAGNOSIS — J309 Allergic rhinitis, unspecified: Secondary | ICD-10-CM | POA: Diagnosis not present

## 2021-03-10 ENCOUNTER — Ambulatory Visit (INDEPENDENT_AMBULATORY_CARE_PROVIDER_SITE_OTHER): Payer: No Typology Code available for payment source | Admitting: *Deleted

## 2021-03-10 DIAGNOSIS — J309 Allergic rhinitis, unspecified: Secondary | ICD-10-CM | POA: Diagnosis not present

## 2021-03-11 ENCOUNTER — Ambulatory Visit: Payer: No Typology Code available for payment source | Admitting: Allergy

## 2021-03-11 NOTE — Progress Notes (Deleted)
Follow Up Note  RE: Gerald Butler. MRN: [id number] DOB: [birth year] Date of Office Visit: 03/11/2021  Referring provider: [facility] Primary care provider: [facility]  Chief Complaint: No chief complaint on file.  History of Present Illness: I had the pleasure of seeing Gerald Butler for a follow up visit at the [facility] on 03/11/2021. He is a 50 y.o. male, who is being followed for asthma, allergic rhinoconjunctivitis on AIT. His previous allergy office visit was on 11/12/2020 with Dr. Selena Batten. Today is a regular follow up visit.  12/03/2020  Isabella Stalling. started injections for  G-RW-W-T-DM, M-C-D-CR  Moderate persistent asthma without complication Past history - Diagnosed with asthma 18 years ago and had positive methacholine challenge test in 2016. Wears CPAP at night. Triggers include allergies. Interim history - Doing much better with Breo.   Today's ACT score was 23.  Today's spirometry showed: possible restrictive disease. Improved from previous one.   Daily controller medication(s):continue Breo 1 puff once a day. Sample given.  ? If it's not covered let us know and will send in an alternative ICS/LABA combo inhaler.   May use albuterol rescue inhaler 2 puffs every 4 to 6 hours as needed for shortness of breath, chest tightness, coughing, and wheezing. May use albuterol rescue inhaler 2 puffs 5 to 15 minutes prior to strenuous physical activities. Monitor frequency of use.   Seasonal and perennial allergic rhinoconjunctivitis Past history - Perennial rhinoconjunctivitis symptoms for 25 years with worsening in the spring.  Tried Zyrtec, azelastine, Flonase, Singulair with some benefit. Immunocap in the past was positive to cat, dog, grass, cockroach, mold, trees, ragweed, weed.  No prior ENT evaluation.  Takes PPI for reflux.  Diagnosed recently with glaucoma. 2021 skin testing showed: Positive to grass, weed, ragweed, trees,  mold, dust mites, cat, dog, cockroach.  Interim history - Stable with below regimen.  Continue environmental control measures.  Continue Singulair (montelukast) 10mg  daily at night.  May use over the counter antihistamines such as Zyrtec (cetirizine), Claritin (loratadine), Allegra (fexofenadine), or Xyzal (levocetirizine) daily as needed.  Had a detailed discussion with patient/family that clinical history is suggestive of allergic rhinitis, and may benefit from allergy immunotherapy (AIT). Discussed in detail regarding the dosing, schedule, side effects (mild to moderate local allergic reaction and rarely systemic allergic reactions including anaphylaxis), and benefits (significant improvement in nasal symptoms, seasonal flares of asthma) of immunotherapy with the patient. There is significant time commitment involved with allergy shots, which includes weekly immunotherapy injections for first 9-12 months and then biweekly to monthly injections for 3-5 years. Consent was signed.  Start allergy injections - Rx made based on latest skin testing and bloodwork results.  I have prescribed epinephrine injectable and demonstrated proper use. For mild symptoms you can take over the counter antihistamines such as Benadryl and monitor symptoms closely. If symptoms worsen or if you have severe symptoms including breathing issues, throat closure, significant swelling, whole body hives, severe diarrhea and vomiting, lightheadedness then inject epinephrine and seek immediate medical care afterwards.  Emergency action plan given.   Return in about 4 months (around 03/12/2021).   Assessment and Plan: Gerald Butler is a 50 y.o. male with: No problem-specific Assessment & Plan notes found for this encounter.  No follow-ups on file.  No orders of the defined types were placed in this encounter.  Lab Orders  No laboratory test(s) ordered today    Diagnostics: Spirometry:  Tracings reviewed. His  effort:  {Blank single:19197::"Good reproducible efforts.","It was hard to get consistent efforts and there is a question as to whether this reflects a maximal maneuver.","Poor effort, data can not be interpreted."} FVC: ***L FEV1: ***L, ***% predicted FEV1/FVC ratio: ***% Interpretation: {Blank single:19197::"Spirometry consistent with mild obstructive disease","Spirometry consistent with moderate obstructive disease","Spirometry consistent with severe obstructive disease","Spirometry consistent with possible restrictive disease","Spirometry consistent with mixed obstructive and restrictive disease","Spirometry uninterpretable due to technique","Spirometry consistent with normal pattern","No overt abnormalities noted given today's efforts"}.  Please see scanned spirometry results for details.  Skin Testing: {Blank single:19197::"Select foods","Environmental allergy panel","Environmental allergy panel and select foods","Food allergy panel","None","Deferred due to recent antihistamines use"}. Positive test to: ***. Negative test to: ***.  Results discussed with patient/family.   Medication List:  Current Outpatient Medications  Medication Sig Dispense Refill  . albuterol (VENTOLIN HFA) 108 (90 Base) MCG/ACT inhaler Inhale 2 puffs into the lungs every 6 (six) hours as needed for wheezing or shortness of breath.    . Azelastine HCl 137 MCG/SPRAY SOLN Place 2 sprays into the nose daily.    . busPIRone (BUSPAR) 15 MG tablet Take 7.5 mg by mouth daily.    . cetirizine (ZYRTEC) 10 MG tablet Take 10 mg by mouth daily.    Marland Kitchen EPINEPHrine 0.3 mg/0.3 mL IJ SOAJ injection Inject 0.3 mg into the muscle as needed. 1 each 2  . escitalopram (LEXAPRO) 20 MG tablet Take 20 mg by mouth daily.    . fluticasone furoate-vilanterol (BREO ELLIPTA) 200-25 MCG/INH AEPB Inhale 1 puff into the lungs daily. Rinse mouth after each use. 60 each 5  . montelukast (SINGULAIR) 10 MG tablet Take 1 tablet (10 mg total) by mouth at bedtime.  30 tablet 5  . Olodaterol HCl 2.5 MCG/ACT AERS Inhale 2 puffs into the lungs daily.    Marland Kitchen omeprazole (PRILOSEC) 40 MG capsule Take 40 mg by mouth in the morning and at bedtime.     No current facility-administered medications for this visit.   Allergies: No Known Allergies I reviewed his past medical history, social history, family history, and environmental history and no significant changes have been reported from his previous visit.  Review of Systems  Constitutional: Negative for appetite change, chills, fever and unexpected weight change.  HENT: Negative for congestion and rhinorrhea.   Eyes: Negative for itching.  Respiratory: Negative for cough, chest tightness, shortness of breath and wheezing.   Cardiovascular: Negative for chest pain.  Gastrointestinal: Negative for abdominal pain.  Genitourinary: Negative for difficulty urinating.  Skin: Negative for rash.  Allergic/Immunologic: Positive for environmental allergies.  Neurological: Negative for headaches.   Objective: There were no vitals taken for this visit. There is no height or weight on file to calculate BMI. Physical Exam Vitals and nursing note reviewed.  Constitutional:      Appearance: Normal appearance. He is well-developed.  HENT:     Head: Normocephalic and atraumatic.     Right Ear: Tympanic membrane and external ear normal.     Left Ear: Tympanic membrane and external ear normal.     Nose: Nose normal.     Mouth/Throat:     Mouth: Mucous membranes are moist.     Pharynx: Oropharynx is clear.  Eyes:     Conjunctiva/sclera: Conjunctivae normal.  Cardiovascular:     Rate and Rhythm: Normal rate and regular rhythm.     Heart sounds: Normal heart sounds. No murmur heard. No friction rub. No gallop.   Pulmonary:     Effort: Pulmonary effort  is normal.     Breath sounds: Normal breath sounds. No wheezing, rhonchi or rales.  Musculoskeletal:     Cervical back: Neck supple.  Skin:    General: Skin is  warm.     Findings: No rash.  Neurological:     Mental Status: He is alert and oriented to person, place, and time.  Psychiatric:        Behavior: Behavior normal.    Previous notes and tests were reviewed. The plan was reviewed with the patient/family, and all questions/concerned were addressed.  It was my pleasure to see Gerald Butler today and participate in his care. Please feel free to contact me with any questions or concerns.  Sincerely,  Wyline Mood, DO Allergy & Immunology  [facility] office: [phone] [facility] office: [phone]

## 2021-03-17 ENCOUNTER — Ambulatory Visit (INDEPENDENT_AMBULATORY_CARE_PROVIDER_SITE_OTHER): Payer: No Typology Code available for payment source | Admitting: *Deleted

## 2021-03-17 DIAGNOSIS — J309 Allergic rhinitis, unspecified: Secondary | ICD-10-CM | POA: Diagnosis not present

## 2021-03-24 ENCOUNTER — Ambulatory Visit (INDEPENDENT_AMBULATORY_CARE_PROVIDER_SITE_OTHER): Payer: No Typology Code available for payment source | Admitting: *Deleted

## 2021-03-24 DIAGNOSIS — J309 Allergic rhinitis, unspecified: Secondary | ICD-10-CM

## 2021-03-24 NOTE — Progress Notes (Signed)
Follow Up Note  RE: Gerald Butler. MRN: [id number] DOB: Oct 24, 1971 Date of Office Visit: 03/25/2021  Referring provider: [facility] care provider: [facility]  Chief Complaint: Asthma (ACT - 19 allergy shots have helped ) and Allergic Rhinitis  (Allergy shots have helped no issues at this time )  History of Present Illness: I had the pleasure of seeing Gerald Butler for a follow up visit at the [facility] on 03/25/2021. He is a 50 y.o. male, who is being followed for asthma and allergic rhinoconjunctivitis on AIT. His previous allergy office visit was on 11/12/2020 with Dr. Selena Batten. Today is a regular follow up visit.  Moderate persistent asthma ACT score 19  Patient ran out of Breo a few months ago and was doing well up until the last 2 weeks with some chest congestion, wheezing. Using albuterol twice per week now.  Denies any ER/urgent care visits or prednisone use since the last visit.  Seasonal and perennial allergic rhinoconjunctivitis Taking Singulair daily. Doing well with the allergy injections with no local reactions. Symptoms stable.   Assessment and Plan: Gerald Butler is a 50 y.o. male with: Moderate persistent asthma without complication Past history - Diagnosed with asthma 18 years ago and had positive methacholine challenge test in 2016. Wears CPAP at night. Triggers include allergies. Interim history - Breo not covered and no daily inhalers for a few months. Noticing wheezing and chest congestion now with the pollen in the air.  Today's ACT score was 19. . Today's spirometry showed: possible restrictive disease. Worse than previous one.  Daily controller medication(s): START Advair 1 puff twice a day with spacer and rinse mouth afterwards.  May take up to 2 puffs twice a day if needed.   If it's not covered let us know. . May use albuterol rescue inhaler 2 puffs every 4 to 6 hours as needed for shortness of breath,  chest tightness, coughing, and wheezing. May use albuterol rescue inhaler 2 puffs 5 to 15 minutes prior to strenuous physical activities. Monitor frequency of use.  . Repeat spirometry at next visit.  Seasonal and perennial allergic rhinoconjunctivitis Past history - Perennial rhinoconjunctivitis symptoms for 25 years with worsening in the spring. Immunocap in the past was positive to cat, dog, grass, cockroach, mold, trees, ragweed, weed.  No prior ENT evaluation.  Takes PPI for reflux.  Diagnosed recently with glaucoma. 2021 skin testing showed: Positive to grass, weed, ragweed, trees, mold, dust mites, cat, dog, cockroach.  Interim history - Started AIT on 12/03/2020 (G-RW-W-T-DM, M-C-D-CR) with no issues. Symptoms stable.   Continue environmental control measures. Continue Singulair (montelukast) 10mg  daily at night.  May use over the counter antihistamines such as Zyrtec (cetirizine), Claritin (loratadine), Allegra (fexofenadine), or Xyzal (levocetirizine) daily as needed.  Continue allergy injections.   Nasal saline spray (i.e., Simply Saline) or nasal saline lavage (i.e., NeilMed) is recommended as needed and prior to medicated nasal sprays.  Return in about 4 months (around 07/25/2021).  Meds ordered this encounter  Medications  . fluticasone-salmeterol (ADVAIR HFA) 115-21 MCG/ACT inhaler    Sig: Inhale 2 puffs into the lungs 2 (two) times daily. with spacer and rinse mouth afterwards.    Dispense:  1 each    Refill:  5   Lab Orders  No laboratory test(s) ordered today    Diagnostics: Spirometry:  Tracings reviewed. His effort: Good reproducible efforts. FVC: 2.64L FEV1: 1.95L, 61% predicted FEV1/FVC ratio: 74% Interpretation: Spirometry consistent  with possible restrictive disease.  Please see scanned spirometry results for details.  Medication List:  Current Outpatient Medications  Medication Sig Dispense Refill  . albuterol (VENTOLIN HFA) 108 (90 Base) MCG/ACT  inhaler Inhale 2 puffs into the lungs every 6 (six) hours as needed for wheezing or shortness of breath.    . busPIRone (BUSPAR) 15 MG tablet Take 7.5 mg by mouth daily.    . cetirizine (ZYRTEC) 10 MG tablet Take 10 mg by mouth daily.    Marland Kitchen EPINEPHrine 0.3 mg/0.3 mL IJ SOAJ injection Inject 0.3 mg into the muscle as needed. 1 each 2  . escitalopram (LEXAPRO) 20 MG tablet Take 20 mg by mouth daily.    . fluticasone-salmeterol (ADVAIR HFA) 115-21 MCG/ACT inhaler Inhale 2 puffs into the lungs 2 (two) times daily. with spacer and rinse mouth afterwards. 1 each 5  . montelukast (SINGULAIR) 10 MG tablet Take 1 tablet (10 mg total) by mouth at bedtime. 30 tablet 5  . Olodaterol HCl 2.5 MCG/ACT AERS Inhale 2 puffs into the lungs daily.    Marland Kitchen omeprazole (PRILOSEC) 40 MG capsule Take 40 mg by mouth in the morning and at bedtime.    . Azelastine HCl 137 MCG/SPRAY SOLN Place 2 sprays into the nose daily. (Patient not taking: Reported on 03/25/2021)     No current facility-administered medications for this visit.   Allergies: No Known Allergies I reviewed his past medical history, social history, family history, and environmental history and no significant changes have been reported from his previous visit.  Review of Systems  Constitutional: Negative for appetite change, chills, fever and unexpected weight change.  HENT: Negative for congestion and rhinorrhea.   Eyes: Negative for itching.  Respiratory: Negative for cough, chest tightness, shortness of breath and wheezing.   Cardiovascular: Negative for chest pain.  Gastrointestinal: Negative for abdominal pain.  Genitourinary: Negative for difficulty urinating.  Skin: Negative for rash.  Allergic/Immunologic: Positive for environmental allergies.  Neurological: Negative for headaches.   Objective: BP 126/80   Pulse 60   Temp (!) 97.2 F (36.2 C)   Resp 18   Ht 5\' 8"  (1.727 m)   Wt 287 lb (130.2 kg)   SpO2 97%   BMI 43.64 kg/m  Body mass  index is 43.64 kg/m. Physical Exam Vitals and nursing note reviewed.  Constitutional:      Appearance: Normal appearance. He is well-developed.  HENT:     Head: Normocephalic and atraumatic.     Right Ear: Tympanic membrane and external ear normal.     Left Ear: Tympanic membrane and external ear normal.     Nose: Congestion and rhinorrhea present.     Mouth/Throat:     Mouth: Mucous membranes are moist.     Pharynx: Oropharynx is clear.  Eyes:     Conjunctiva/sclera: Conjunctivae normal.  Cardiovascular:     Rate and Rhythm: Normal rate and regular rhythm.     Heart sounds: Normal heart sounds. No murmur heard. No friction rub. No gallop.   Pulmonary:     Effort: Pulmonary effort is normal.     Breath sounds: Normal breath sounds. No wheezing, rhonchi or rales.  Musculoskeletal:     Cervical back: Neck supple.  Skin:    General: Skin is warm.     Findings: No rash.  Neurological:     Mental Status: He is alert and oriented to person, place, and time.  Psychiatric:        Behavior: Behavior normal.  Previous notes and tests were reviewed. The plan was reviewed with the patient/family, and all questions/concerned were addressed.  It was my pleasure to see Gerald Butler today and participate in his care. Please feel free to contact me with any questions or concerns.  Sincerely,  Wyline Mood, DO Allergy & Immunology  [facility] office: [phone] [facility] office: [phone]

## 2021-03-25 ENCOUNTER — Encounter: Payer: Self-pay | Admitting: Allergy

## 2021-03-25 ENCOUNTER — Other Ambulatory Visit: Payer: Self-pay

## 2021-03-25 ENCOUNTER — Ambulatory Visit (INDEPENDENT_AMBULATORY_CARE_PROVIDER_SITE_OTHER): Payer: No Typology Code available for payment source | Admitting: Allergy

## 2021-03-25 VITALS — BP 126/80 | HR 60 | Temp 97.2°F | Resp 18 | Ht 68.0 in | Wt 287.0 lb

## 2021-03-25 DIAGNOSIS — J3089 Other allergic rhinitis: Secondary | ICD-10-CM

## 2021-03-25 DIAGNOSIS — H1010 Acute atopic conjunctivitis, unspecified eye: Secondary | ICD-10-CM

## 2021-03-25 DIAGNOSIS — H1013 Acute atopic conjunctivitis, bilateral: Secondary | ICD-10-CM

## 2021-03-25 DIAGNOSIS — J302 Other seasonal allergic rhinitis: Secondary | ICD-10-CM | POA: Diagnosis not present

## 2021-03-25 DIAGNOSIS — J4540 Moderate persistent asthma, uncomplicated: Secondary | ICD-10-CM | POA: Diagnosis not present

## 2021-03-25 MED ORDER — ADVAIR HFA 115-21 MCG/ACT IN AERO: 2.0000 | INHALATION_SPRAY | Freq: Two times a day (BID) | RESPIRATORY_TRACT | 5 refills | Status: DC

## 2021-03-25 NOTE — Assessment & Plan Note (Addendum)
Past history - Perennial rhinoconjunctivitis symptoms for 25 years with worsening in the spring. Immunocap in the past was positive to cat, dog, grass, cockroach, mold, trees, ragweed, weed.  No prior ENT evaluation.  Takes PPI for reflux.  Diagnosed recently with glaucoma. 2021 skin testing showed: Positive to grass, weed, ragweed, trees, mold, dust mites, cat, dog, cockroach.  Interim history - Started AIT on 12/03/2020 (G-RW-W-T-DM, M-C-D-CR) with no issues. Symptoms stable.   Continue environmental control measures. Continue Singulair (montelukast) 10mg  daily at night.  May use over the counter antihistamines such as Zyrtec (cetirizine), Claritin (loratadine), Allegra (fexofenadine), or Xyzal (levocetirizine) daily as needed.  Continue allergy injections.   Nasal saline spray (i.e., Simply Saline) or nasal saline lavage (i.e., NeilMed) is recommended as needed and prior to medicated nasal sprays.

## 2021-03-25 NOTE — Patient Instructions (Addendum)
Environmental allergies  Past skin testing showed: Positive to grass, weed, ragweed, trees, mold, dust mites, cat, dog, cockroach.  Continue environmental control measures. Continue Singulair (montelukast) 10mg  daily at night.  May use over the counter antihistamines such as Zyrtec (cetirizine), Claritin (loratadine), Allegra (fexofenadine), or Xyzal (levocetirizine) daily as needed.  Continue allergy injections.   Nasal saline spray (i.e., Simply Saline) or nasal saline lavage (i.e., NeilMed) is recommended as needed and prior to medicated nasal sprays.  Asthma:  Daily controller medication(s): START Advair 115mcg 1 puff twice a day with spacer and rinse mouth afterwards.  May take up to 2 puffs twice a day if needed.   If it's not covered let us know by next week. . May use albuterol rescue inhaler 2 puffs every 4 to 6 hours as needed for shortness of breath, chest tightness, coughing, and wheezing. May use albuterol rescue inhaler 2 puffs 5 to 15 minutes prior to strenuous physical activities. Monitor frequency of use.  . Asthma control goals:  o Full participation in all desired activities (may need albuterol before activity) o Albuterol use two times or less a week on average (not counting use with activity) o Cough interfering with sleep two times or less a month o Oral steroids no more than once a year o No hospitalizations  Follow up in 4 months or sooner if needed.   Reducing Pollen Exposure . Pollen seasons: trees (spring), grass (summer) and ragweed/weeds (fall). Marland Kitchen. Keep windows closed in your home and car to lower pollen exposure.  Lilian Kapur. Install air conditioning in the bedroom and throughout the house if possible.  . Avoid going out in dry windy days - especially early morning. . Pollen counts are highest between 5 - 10 AM and on dry, hot and windy days.  . Save outside activities for late afternoon or after a heavy rain, when pollen levels are lower.  . Avoid mowing of  grass if you have grass pollen allergy. Marland Kitchen. Be aware that pollen can also be transported indoors on people and pets.  . Dry your clothes in an automatic dryer rather than hanging them outside where they might collect pollen.  . Rinse hair and eyes before bedtime.  Mold Control . Mold and fungi can grow on a variety of surfaces provided certain temperature and moisture conditions exist.  . Outdoor molds grow on plants, decaying vegetation and soil. The major outdoor mold, Alternaria and Cladosporium, are found in very high numbers during hot and dry conditions. Generally, a late summer - fall peak is seen for common outdoor fungal spores. Rain will temporarily lower outdoor mold spore count, but counts rise rapidly when the rainy period ends. . The most important indoor molds are Aspergillus and Penicillium. Dark, humid and poorly ventilated basements are ideal sites for mold growth. The next most common sites of mold growth are the bathroom and the kitchen. Outdoor (Seasonal) Mold Control . Use air conditioning and keep windows closed. . Avoid exposure to decaying vegetation. Marland Kitchen. Avoid leaf raking. . Avoid grain handling. . Consider wearing a face mask if working in moldy areas.  Indoor (Perennial) Mold Control  . Maintain humidity below 50%. . Get rid of mold growth on hard surfaces with water, detergent and, if necessary, 5% bleach (do not mix with other cleaners). Then dry the area completely. If mold covers an area more than 10 square feet, consider hiring an indoor environmental professional. . For clothing, washing with soap and water is best. If moldy  items cannot be cleaned and dried, throw them away. . Remove sources e.g. contaminated carpets. . Repair and seal leaking roofs or pipes. Using dehumidifiers in damp basements may be helpful, but empty the water and clean units regularly to prevent mildew from forming. All rooms, especially basements, bathrooms and kitchens, require ventilation  and cleaning to deter mold and mildew growth. Avoid carpeting on concrete or damp floors, and storing items in damp areas. Control of House Dust Mite Allergen . Dust mite allergens are a common trigger of allergy and asthma symptoms. While they can be found throughout the house, these microscopic creatures thrive in warm, humid environments such as bedding, upholstered furniture and carpeting. . Because so much time is spent in the bedroom, it is essential to reduce mite levels there.  . Encase pillows, mattresses, and box springs in special allergen-proof fabric covers or airtight, zippered plastic covers.  . Bedding should be washed weekly in hot water (130 F) and dried in a hot dryer. Allergen-proof covers are available for comforters and pillows that can't be regularly washed.  Reyes Ivan the allergy-proof covers every few months. Minimize clutter in the bedroom. Keep pets out of the bedroom.  Marland Kitchen Keep humidity less than 50% by using a dehumidifier or air conditioning. You can buy a humidity measuring device called a hygrometer to monitor this.  . If possible, replace carpets with hardwood, linoleum, or washable area rugs. If that's not possible, vacuum frequently with a vacuum that has a HEPA filter. . Remove all upholstered furniture and non-washable window drapes from the bedroom. . Remove all non-washable stuffed toys from the bedroom.  Wash stuffed toys weekly. Pet Allergen Avoidance: . Contrary to popular opinion, there are no "hypoallergenic" breeds of dogs or cats. That is because people are not allergic to an animal's hair, but to an allergen found in the animal's saliva, dander (dead skin flakes) or urine. Pet allergy symptoms typically occur within minutes. For some people, symptoms can build up and become most severe 8 to 12 hours after contact with the animal. People with severe allergies can experience reactions in public places if dander has been transported on the pet owners'  clothing. Marland Kitchen Keeping an animal outdoors is only a partial solution, since homes with pets in the yard still have higher concentrations of animal allergens. . Before getting a pet, ask your allergist to determine if you are allergic to animals. If your pet is already considered part of your family, try to minimize contact and keep the pet out of the bedroom and other rooms where you spend a great deal of time. . As with dust mites, vacuum carpets often or replace carpet with a hardwood floor, tile or linoleum. . High-efficiency particulate air (HEPA) cleaners can reduce allergen levels over time. . While dander and saliva are the source of cat and dog allergens, urine is the source of allergens from rabbits, hamsters, mice and [place] pigs; so ask a non-allergic family member to clean the animal's cage. . If you have a pet allergy, talk to your allergist about the potential for allergy immunotherapy (allergy shots). This strategy can often provide long-term relief. Cockroach Allergen Avoidance Cockroaches are often found in the homes of densely populated urban areas, schools or commercial buildings, but these creatures can lurk almost anywhere. This does not mean that you have a dirty house or living area. . Block all areas where roaches can enter the home. This includes crevices, wall cracks and windows.  . Cockroaches  need water to survive, so fix and seal all leaky faucets and pipes. Have an exterminator go through the house when your family and pets are gone to eliminate any remaining roaches. Marland Kitchen Keep food in lidded containers and put pet food dishes away after your pets are done eating. Vacuum and sweep the floor after meals, and take out garbage and recyclables. Use lidded garbage containers in the kitchen. Wash dishes immediately after use and clean under stoves, refrigerators or toasters where crumbs can accumulate. Wipe off the stove and other kitchen surfaces and cupboards regularly.   Today's  skin testing showed: Positive to grass, weed, ragweed, trees, mold, dust mites, cat, dog, cockroach. Results given.  Environmental allergies  Start environmental control measures as below. Start Singulair (montelukast) 10mg  daily at night. Cautioned that in some children/adults can experience behavioral changes including hyperactivity, agitation, depression, sleep disturbances and suicidal ideations. These side effects are rare, but if you notice them you should notify me and discontinue Singulair (montelukast).  May use over the counter antihistamines such as Zyrtec (cetirizine), Claritin (loratadine), Allegra (fexofenadine), or Xyzal (levocetirizine) daily as needed.  Had a detailed discussion with patient/family that clinical history is suggestive of allergic rhinitis, and may benefit from allergy immunotherapy (AIT). Discussed in detail regarding the dosing, schedule, side effects (mild to moderate local allergic reaction and rarely systemic allergic reactions including anaphylaxis), and benefits (significant improvement in nasal symptoms, seasonal flares of asthma) of immunotherapy with the patient. There is significant time commitment involved with allergy shots, which includes weekly immunotherapy injections for first 9-12 months and then biweekly to monthly injections for 3-5 years.   Read about allergy injections - will start once your asthma is more stable.  Asthma: . Daily controller medication(s): Start Breo 1 puff once a day. Sample given. Demonstrated proper use. o Stop mometasone inhaler.  . May use albuterol rescue inhaler 2 puffs every 4 to 6 hours as needed for shortness of breath, chest tightness, coughing, and wheezing. May use albuterol rescue inhaler 2 puffs 5 to 15 minutes prior to strenuous physical activities. Monitor frequency of use.  . Asthma control goals:  o Full participation in all desired activities (may need albuterol before activity) o Albuterol use two  times or less a week on average (not counting use with activity) o Cough interfering with sleep two times or less a month o Oral steroids no more than once a year o No hospitalizations  Follow up in 2 months or sooner if needed.   Reducing Pollen Exposure . Pollen seasons: trees (spring), grass (summer) and ragweed/weeds (fall). 05-05-1980 Keep windows closed in your home and car to lower pollen exposure.  Marland Kitchen air conditioning in the bedroom and throughout the house if possible.  . Avoid going out in dry windy days - especially early morning. . Pollen counts are highest between 5 - 10 AM and on dry, hot and windy days.  . Save outside activities for late afternoon or after a heavy rain, when pollen levels are lower.  . Avoid mowing of grass if you have grass pollen allergy. Lilian Kapur Be aware that pollen can also be transported indoors on people and pets.  . Dry your clothes in an automatic dryer rather than hanging them outside where they might collect pollen.  . Rinse hair and eyes before bedtime.  Mold Control . Mold and fungi can grow on a variety of surfaces provided certain temperature and moisture conditions exist.  . Outdoor molds grow on  plants, decaying vegetation and soil. The major outdoor mold, Alternaria and Cladosporium, are found in very high numbers during hot and dry conditions. Generally, a late summer - fall peak is seen for common outdoor fungal spores. Rain will temporarily lower outdoor mold spore count, but counts rise rapidly when the rainy period ends. . The most important indoor molds are Aspergillus and Penicillium. Dark, humid and poorly ventilated basements are ideal sites for mold growth. The next most common sites of mold growth are the bathroom and the kitchen. Outdoor (Seasonal) Mold Control . Use air conditioning and keep windows closed. . Avoid exposure to decaying vegetation. Marland Kitchen Avoid leaf raking. . Avoid grain handling. . Consider wearing a face mask if working  in moldy areas.  Indoor (Perennial) Mold Control  . Maintain humidity below 50%. . Get rid of mold growth on hard surfaces with water, detergent and, if necessary, 5% bleach (do not mix with other cleaners). Then dry the area completely. If mold covers an area more than 10 square feet, consider hiring an indoor environmental professional. . For clothing, washing with soap and water is best. If moldy items cannot be cleaned and dried, throw them away. . Remove sources e.g. contaminated carpets. . Repair and seal leaking roofs or pipes. Using dehumidifiers in damp basements may be helpful, but empty the water and clean units regularly to prevent mildew from forming. All rooms, especially basements, bathrooms and kitchens, require ventilation and cleaning to deter mold and mildew growth. Avoid carpeting on concrete or damp floors, and storing items in damp areas. Control of House Dust Mite Allergen . Dust mite allergens are a common trigger of allergy and asthma symptoms. While they can be found throughout the house, these microscopic creatures thrive in warm, humid environments such as bedding, upholstered furniture and carpeting. . Because so much time is spent in the bedroom, it is essential to reduce mite levels there.  . Encase pillows, mattresses, and box springs in special allergen-proof fabric covers or airtight, zippered plastic covers.  . Bedding should be washed weekly in hot water (130 F) and dried in a hot dryer. Allergen-proof covers are available for comforters and pillows that can't be regularly washed.  Reyes Ivan the allergy-proof covers every few months. Minimize clutter in the bedroom. Keep pets out of the bedroom.  Marland Kitchen Keep humidity less than 50% by using a dehumidifier or air conditioning. You can buy a humidity measuring device called a hygrometer to monitor this.  . If possible, replace carpets with hardwood, linoleum, or washable area rugs. If that's not possible, vacuum frequently  with a vacuum that has a HEPA filter. . Remove all upholstered furniture and non-washable window drapes from the bedroom. . Remove all non-washable stuffed toys from the bedroom.  Wash stuffed toys weekly. Pet Allergen Avoidance: . Contrary to popular opinion, there are no "hypoallergenic" breeds of dogs or cats. That is because people are not allergic to an animal's hair, but to an allergen found in the animal's saliva, dander (dead skin flakes) or urine. Pet allergy symptoms typically occur within minutes. For some people, symptoms can build up and become most severe 8 to 12 hours after contact with the animal. People with severe allergies can experience reactions in public places if dander has been transported on the pet owners' clothing. Marland Kitchen Keeping an animal outdoors is only a partial solution, since homes with pets in the yard still have higher concentrations of animal allergens. . Before getting a pet, ask your  allergist to determine if you are allergic to animals. If your pet is already considered part of your family, try to minimize contact and keep the pet out of the bedroom and other rooms where you spend a great deal of time. . As with dust mites, vacuum carpets often or replace carpet with a hardwood floor, tile or linoleum. . High-efficiency particulate air (HEPA) cleaners can reduce allergen levels over time. . While dander and saliva are the source of cat and dog allergens, urine is the source of allergens from rabbits, hamsters, mice and [place] pigs; so ask a non-allergic family member to clean the animal's cage. . If you have a pet allergy, talk to your allergist about the potential for allergy immunotherapy (allergy shots). This strategy can often provide long-term relief. Cockroach Allergen Avoidance Cockroaches are often found in the homes of densely populated urban areas, schools or commercial buildings, but these creatures can lurk almost anywhere. This does not mean that you have a  dirty house or living area. . Block all areas where roaches can enter the home. This includes crevices, wall cracks and windows.  . Cockroaches need water to survive, so fix and seal all leaky faucets and pipes. Have an exterminator go through the house when your family and pets are gone to eliminate any remaining roaches. Marland Kitchen Keep food in lidded containers and put pet food dishes away after your pets are done eating. Vacuum and sweep the floor after meals, and take out garbage and recyclables. Use lidded garbage containers in the kitchen. Wash dishes immediately after use and clean under stoves, refrigerators or toasters where crumbs can accumulate. Wipe off the stove and other kitchen surfaces and cupboards regularly.

## 2021-03-25 NOTE — Assessment & Plan Note (Signed)
Past history - Diagnosed with asthma 18 years ago and had positive methacholine challenge test in 2016. Wears CPAP at night. Triggers include allergies. Interim history - Breo not covered and no daily inhalers for a few months. Noticing wheezing and chest congestion now with the pollen in the air.  Today's ACT score was 19. . Today's spirometry showed: possible restrictive disease. Worse than previous one.  Daily controller medication(s): START Advair 1 puff twice a day with spacer and rinse mouth afterwards.  May take up to 2 puffs twice a day if needed.   If it's not covered let us know. . May use albuterol rescue inhaler 2 puffs every 4 to 6 hours as needed for shortness of breath, chest tightness, coughing, and wheezing. May use albuterol rescue inhaler 2 puffs 5 to 15 minutes prior to strenuous physical activities. Monitor frequency of use.  . Repeat spirometry at next visit.

## 2021-04-03 ENCOUNTER — Ambulatory Visit (INDEPENDENT_AMBULATORY_CARE_PROVIDER_SITE_OTHER): Payer: No Typology Code available for payment source

## 2021-04-03 DIAGNOSIS — J309 Allergic rhinitis, unspecified: Secondary | ICD-10-CM | POA: Diagnosis not present

## 2021-04-07 ENCOUNTER — Ambulatory Visit (INDEPENDENT_AMBULATORY_CARE_PROVIDER_SITE_OTHER): Payer: No Typology Code available for payment source | Admitting: *Deleted

## 2021-04-07 DIAGNOSIS — J309 Allergic rhinitis, unspecified: Secondary | ICD-10-CM | POA: Diagnosis not present

## 2021-04-15 ENCOUNTER — Ambulatory Visit (INDEPENDENT_AMBULATORY_CARE_PROVIDER_SITE_OTHER): Payer: No Typology Code available for payment source

## 2021-04-15 DIAGNOSIS — J309 Allergic rhinitis, unspecified: Secondary | ICD-10-CM | POA: Diagnosis not present

## 2021-04-23 ENCOUNTER — Ambulatory Visit (INDEPENDENT_AMBULATORY_CARE_PROVIDER_SITE_OTHER): Payer: No Typology Code available for payment source | Admitting: *Deleted

## 2021-04-23 DIAGNOSIS — J309 Allergic rhinitis, unspecified: Secondary | ICD-10-CM

## 2021-04-30 ENCOUNTER — Ambulatory Visit (INDEPENDENT_AMBULATORY_CARE_PROVIDER_SITE_OTHER): Payer: No Typology Code available for payment source | Admitting: *Deleted

## 2021-04-30 DIAGNOSIS — J309 Allergic rhinitis, unspecified: Secondary | ICD-10-CM

## 2021-05-06 ENCOUNTER — Ambulatory Visit (INDEPENDENT_AMBULATORY_CARE_PROVIDER_SITE_OTHER): Payer: No Typology Code available for payment source

## 2021-05-06 DIAGNOSIS — J309 Allergic rhinitis, unspecified: Secondary | ICD-10-CM

## 2021-05-13 ENCOUNTER — Ambulatory Visit (INDEPENDENT_AMBULATORY_CARE_PROVIDER_SITE_OTHER): Payer: No Typology Code available for payment source

## 2021-05-13 DIAGNOSIS — J309 Allergic rhinitis, unspecified: Secondary | ICD-10-CM | POA: Diagnosis not present

## 2021-05-22 ENCOUNTER — Ambulatory Visit (INDEPENDENT_AMBULATORY_CARE_PROVIDER_SITE_OTHER): Payer: No Typology Code available for payment source

## 2021-05-22 DIAGNOSIS — J309 Allergic rhinitis, unspecified: Secondary | ICD-10-CM | POA: Diagnosis not present

## 2021-05-29 ENCOUNTER — Ambulatory Visit (INDEPENDENT_AMBULATORY_CARE_PROVIDER_SITE_OTHER): Payer: No Typology Code available for payment source

## 2021-05-29 DIAGNOSIS — J309 Allergic rhinitis, unspecified: Secondary | ICD-10-CM

## 2021-06-05 ENCOUNTER — Ambulatory Visit (INDEPENDENT_AMBULATORY_CARE_PROVIDER_SITE_OTHER): Payer: No Typology Code available for payment source | Admitting: *Deleted

## 2021-06-05 DIAGNOSIS — J309 Allergic rhinitis, unspecified: Secondary | ICD-10-CM

## 2021-06-10 ENCOUNTER — Ambulatory Visit (INDEPENDENT_AMBULATORY_CARE_PROVIDER_SITE_OTHER): Payer: No Typology Code available for payment source

## 2021-06-10 DIAGNOSIS — J309 Allergic rhinitis, unspecified: Secondary | ICD-10-CM | POA: Diagnosis not present

## 2021-06-18 ENCOUNTER — Ambulatory Visit (INDEPENDENT_AMBULATORY_CARE_PROVIDER_SITE_OTHER): Payer: No Typology Code available for payment source | Admitting: *Deleted

## 2021-06-18 DIAGNOSIS — J309 Allergic rhinitis, unspecified: Secondary | ICD-10-CM | POA: Diagnosis not present

## 2021-06-25 ENCOUNTER — Ambulatory Visit (INDEPENDENT_AMBULATORY_CARE_PROVIDER_SITE_OTHER): Payer: No Typology Code available for payment source | Admitting: *Deleted

## 2021-06-25 DIAGNOSIS — J309 Allergic rhinitis, unspecified: Secondary | ICD-10-CM

## 2021-07-03 ENCOUNTER — Ambulatory Visit (INDEPENDENT_AMBULATORY_CARE_PROVIDER_SITE_OTHER): Payer: No Typology Code available for payment source

## 2021-07-03 DIAGNOSIS — J309 Allergic rhinitis, unspecified: Secondary | ICD-10-CM

## 2021-07-10 ENCOUNTER — Ambulatory Visit (INDEPENDENT_AMBULATORY_CARE_PROVIDER_SITE_OTHER): Payer: No Typology Code available for payment source

## 2021-07-10 DIAGNOSIS — J309 Allergic rhinitis, unspecified: Secondary | ICD-10-CM | POA: Diagnosis not present

## 2021-07-17 ENCOUNTER — Ambulatory Visit (INDEPENDENT_AMBULATORY_CARE_PROVIDER_SITE_OTHER): Payer: No Typology Code available for payment source

## 2021-07-17 DIAGNOSIS — J309 Allergic rhinitis, unspecified: Secondary | ICD-10-CM

## 2021-07-23 ENCOUNTER — Ambulatory Visit (INDEPENDENT_AMBULATORY_CARE_PROVIDER_SITE_OTHER): Payer: No Typology Code available for payment source | Admitting: *Deleted

## 2021-07-23 DIAGNOSIS — J309 Allergic rhinitis, unspecified: Secondary | ICD-10-CM

## 2021-07-27 ENCOUNTER — Ambulatory Visit (INDEPENDENT_AMBULATORY_CARE_PROVIDER_SITE_OTHER): Payer: No Typology Code available for payment source | Admitting: Allergy

## 2021-07-27 ENCOUNTER — Encounter: Payer: Self-pay | Admitting: Allergy

## 2021-07-27 ENCOUNTER — Other Ambulatory Visit: Payer: Self-pay

## 2021-07-27 VITALS — BP 122/72 | HR 66 | Temp 97.8°F | Resp 16 | Ht 68.0 in | Wt 292.4 lb

## 2021-07-27 DIAGNOSIS — J302 Other seasonal allergic rhinitis: Secondary | ICD-10-CM | POA: Diagnosis not present

## 2021-07-27 DIAGNOSIS — J4540 Moderate persistent asthma, uncomplicated: Secondary | ICD-10-CM | POA: Diagnosis not present

## 2021-07-27 DIAGNOSIS — J3089 Other allergic rhinitis: Secondary | ICD-10-CM | POA: Diagnosis not present

## 2021-07-27 DIAGNOSIS — H1010 Acute atopic conjunctivitis, unspecified eye: Secondary | ICD-10-CM

## 2021-07-27 DIAGNOSIS — H1013 Acute atopic conjunctivitis, bilateral: Secondary | ICD-10-CM | POA: Diagnosis not present

## 2021-07-27 MED ORDER — ADVAIR HFA 230-21 MCG/ACT IN AERO: 2.0000 | INHALATION_SPRAY | Freq: Two times a day (BID) | RESPIRATORY_TRACT | 5 refills | Status: AC

## 2021-07-27 MED ORDER — MONTELUKAST SODIUM 10 MG PO TABS: 10.0000 mg | ORAL_TABLET | Freq: Every day | ORAL | 5 refills | Status: AC

## 2021-07-27 NOTE — Assessment & Plan Note (Addendum)
Past history - Perennial rhinoconjunctivitis symptoms for 25 years with worsening in the spring. No prior ENT evaluation.  Takes PPI for reflux.  Has glaucoma. 2021 skin testing showed: Positive to grass, weed, ragweed, trees, mold, dust mites, cat, dog, cockroach. Started AIT on 12/03/2020 ([id number], [id number])  Interim history - some drainage in the mornings.   Continue environmental control measures. . Continue Singulair (montelukast) 10mg  daily at night.  Use over the counter antihistamines such as Zyrtec (cetirizine) daily as needed. May take twice a day during allergy flares.   Continue allergy injections.   Nasal saline spray (i.e., Simply Saline) or nasal saline lavage (i.e., NeilMed) is recommended as needed and prior to medicated nasal sprays.

## 2021-07-27 NOTE — Patient Instructions (Addendum)
Environmental allergies Past skin testing showed: Positive to grass, weed, ragweed, trees, mold, dust mites, cat, dog, cockroach. Continue environmental control measures. Continue Singulair (montelukast) 10mg  daily at night. Use over the counter antihistamines such as Zyrtec (cetirizine) daily as needed. May take twice a day during allergy flares.  Continue allergy injections.  Nasal saline spray (i.e., Simply Saline) or nasal saline lavage (i.e., NeilMed) is recommended as needed and prior to medicated nasal sprays.  Asthma: Daily controller medication(s): INCREASE Advair to 2 puffs twice a day with spacer and rinse mouth afterwards. May use albuterol rescue inhaler 2 puffs every 4 to 6 hours as needed for shortness of breath, chest tightness, coughing, and wheezing. May use albuterol rescue inhaler 2 puffs 5 to 15 minutes prior to strenuous physical activities. Monitor frequency of use.  Asthma control goals:  Full participation in all desired activities (may need albuterol before activity) Albuterol use two times or less a week on average (not counting use with activity) Cough interfering with sleep two times or less a month Oral steroids no more than once a year No hospitalizations  Follow up in 4 months or sooner if needed.   Reducing Pollen Exposure Pollen seasons: trees (spring), grass (summer) and ragweed/weeds (fall). Keep windows closed in your home and car to lower pollen exposure.  Install air conditioning in the bedroom and throughout the house if possible.  Avoid going out in dry windy days - especially early morning. Pollen counts are highest between 5 - 10 AM and on dry, hot and windy days.  Save outside activities for late afternoon or after a heavy rain, when pollen levels are lower.  Avoid mowing of grass if you have grass pollen allergy. Be aware that pollen can also be transported indoors on people and pets.  Dry your clothes in an automatic dryer rather than  hanging them outside where they might collect pollen.  Rinse hair and eyes before bedtime.  Mold Control Mold and fungi can grow on a variety of surfaces provided certain temperature and moisture conditions exist.  Outdoor molds grow on plants, decaying vegetation and soil. The major outdoor mold, Alternaria and Cladosporium, are found in very high numbers during hot and dry conditions. Generally, a late summer - fall peak is seen for common outdoor fungal spores. Rain will temporarily lower outdoor mold spore count, but counts rise rapidly when the rainy period ends. The most important indoor molds are Aspergillus and Penicillium. Dark, humid and poorly ventilated basements are ideal sites for mold growth. The next most common sites of mold growth are the bathroom and the kitchen. Outdoor (Seasonal) Mold Control Use air conditioning and keep windows closed. Avoid exposure to decaying vegetation. Avoid leaf raking. Avoid grain handling. Consider wearing a face mask if working in moldy areas.  Indoor (Perennial) Mold Control  Maintain humidity below 50%. Get rid of mold growth on hard surfaces with water, detergent and, if necessary, 5% bleach (do not mix with other cleaners). Then dry the area completely. If mold covers an area more than 10 square feet, consider hiring an indoor environmental professional. For clothing, washing with soap and water is best. If moldy items cannot be cleaned and dried, throw them away. Remove sources e.g. contaminated carpets. Repair and seal leaking roofs or pipes. Using dehumidifiers in damp basements may be helpful, but empty the water and clean units regularly to prevent mildew from forming. All rooms, especially basements, bathrooms and kitchens, require ventilation and cleaning to deter mold  and mildew growth. Avoid carpeting on concrete or damp floors, and storing items in damp areas. Control of House Dust Mite Allergen Dust mite allergens are a common  trigger of allergy and asthma symptoms. While they can be found throughout the house, these microscopic creatures thrive in warm, humid environments such as bedding, upholstered furniture and carpeting. Because so much time is spent in the bedroom, it is essential to reduce mite levels there.  Encase pillows, mattresses, and box springs in special allergen-proof fabric covers or airtight, zippered plastic covers.  Bedding should be washed weekly in hot water (130 F) and dried in a hot dryer. Allergen-proof covers are available for comforters and pillows that can't be regularly washed.  Wash the allergy-proof covers every few months. Minimize clutter in the bedroom. Keep pets out of the bedroom.  Keep humidity less than 50% by using a dehumidifier or air conditioning. You can buy a humidity measuring device called a hygrometer to monitor this.  If possible, replace carpets with hardwood, linoleum, or washable area rugs. If that's not possible, vacuum frequently with a vacuum that has a HEPA filter. Remove all upholstered furniture and non-washable window drapes from the bedroom. Remove all non-washable stuffed toys from the bedroom.  Wash stuffed toys weekly. Pet Allergen Avoidance: Contrary to popular opinion, there are no "hypoallergenic" breeds of dogs or cats. That is because people are not allergic to an animal's hair, but to an allergen found in the animal's saliva, dander (dead skin flakes) or urine. Pet allergy symptoms typically occur within minutes. For some people, symptoms can build up and become most severe 8 to 12 hours after contact with the animal. People with severe allergies can experience reactions in public places if dander has been transported on the pet owners' clothing. Keeping an animal outdoors is only a partial solution, since homes with pets in the yard still have higher concentrations of animal allergens. Before getting a pet, ask your allergist to determine if you are  allergic to animals. If your pet is already considered part of your family, try to minimize contact and keep the pet out of the bedroom and other rooms where you spend a great deal of time. As with dust mites, vacuum carpets often or replace carpet with a hardwood floor, tile or linoleum. High-efficiency particulate air (HEPA) cleaners can reduce allergen levels over time. While dander and saliva are the source of cat and dog allergens, urine is the source of allergens from rabbits, hamsters, mice and [place] pigs; so ask a non-allergic family member to clean the animal's cage. If you have a pet allergy, talk to your allergist about the potential for allergy immunotherapy (allergy shots). This strategy can often provide long-term relief. Cockroach Allergen Avoidance Cockroaches are often found in the homes of densely populated urban areas, schools or commercial buildings, but these creatures can lurk almost anywhere. This does not mean that you have a dirty house or living area. Block all areas where roaches can enter the home. This includes crevices, wall cracks and windows.  Cockroaches need water to survive, so fix and seal all leaky faucets and pipes. Have an exterminator go through the house when your family and pets are gone to eliminate any remaining roaches. Keep food in lidded containers and put pet food dishes away after your pets are done eating. Vacuum and sweep the floor after meals, and take out garbage and recyclables. Use lidded garbage containers in the kitchen. Wash dishes immediately after use and clean  under stoves, refrigerators or toasters where crumbs can accumulate. Wipe off the stove and other kitchen surfaces and cupboards regularly.

## 2021-07-27 NOTE — Progress Notes (Signed)
Follow Up Note  RE: Gerald Butler. MRN: [id number] DOB: 1971/10/18 Date of Office Visit: 07/27/2021  Referring provider: [facility] Primary care provider: [facility]  Chief Complaint: Asthma (ACT - 22 Some shortness of breath and rescue inhaler use ) and Allergic Rhinitis  (No symptoms allergy shots have been helping )  History of Present Illness: I had the pleasure of seeing Gerald Butler for a follow up visit at the [facility] on 07/27/2021. He is a 50 y.o. male, who is being followed for asthma, allergic rhinoconjunctivitis on AIT. His previous allergy office visit was on 03/25/2021 with Dr. Selena Batten. Today is a regular follow up visit.  Moderate persistent asthma ACT score 22. Currently on Advair 2 puffs twice a day and doing well on it.   Using albuterol once a day as he notices sometimes his breathing is not as good with good benefit. Sometimes he doesn't know if he is reaching for the albuterol out of habit or not.   Denies any ER/urgent care visits or prednisone use since the last visit.   Seasonal and perennial allergic rhinoconjunctivitis Currently on allergy injections and doing well on it. He did have some localized reactions initially.  Takes zyrtec as needed only. No eye drops or nasal spray use.  Noticed some PND in the mornings.  Assessment and Plan: [name] is a 50 y.o. male with: Moderate persistent asthma without complication Past history - Diagnosed with asthma 18 years ago and had positive methacholine challenge test in 2016. Wears CPAP at night. Triggers include allergies. Breo not covered. Interim history - doing much better with Advair 2 puffs BID but still reaching for albuterol once a day.  Today's ACT score was 22.  Today's spirometry showed: possible restrictive disease. Improved from previous one.  Daily controller medication(s): INCREASE Advair to 2 puffs twice a day with spacer and rinse mouth  afterwards. May use albuterol rescue inhaler 2 puffs every 4 to 6 hours as needed for shortness of breath, chest tightness, coughing, and wheezing. May use albuterol rescue inhaler 2 puffs 5 to 15 minutes prior to strenuous physical activities. Monitor frequency of use.  Get spirometry at next visit.  Seasonal and perennial allergic rhinoconjunctivitis Past history - Perennial rhinoconjunctivitis symptoms for 25 years with worsening in the spring. No prior ENT evaluation.  Takes PPI for reflux.  Has glaucoma. 2021 skin testing showed: Positive to grass, weed, ragweed, trees, mold, dust mites, cat, dog, cockroach. Started AIT on 12/03/2020 ([id number], M-C-D-CR)  Interim history - some drainage in the mornings.  Continue environmental control measures. Continue Singulair (montelukast) 10mg  daily at night. Use over the counter antihistamines such as Zyrtec (cetirizine) daily as needed. May take twice a day during allergy flares.  Continue allergy injections.  Nasal saline spray (i.e., Simply Saline) or nasal saline lavage (i.e., NeilMed) is recommended as needed and prior to medicated nasal sprays.  Return in about 4 months (around 11/26/2021).  Meds ordered this encounter  Medications   fluticasone-salmeterol (ADVAIR HFA) 230-21 MCG/ACT inhaler    Sig: Inhale 2 puffs into the lungs 2 (two) times daily. with spacer and rinse mouth afterwards.    Dispense:  1 each    Refill:  5   montelukast (SINGULAIR) 10 MG tablet    Sig: Take 1 tablet (10 mg total) by mouth at bedtime.    Dispense:  30 tablet    Refill:  5    Lab Orders  No laboratory test(s) ordered today    Diagnostics: Spirometry:  Tracings reviewed. His effort: Good reproducible efforts. FVC: 2.66L FEV1: 2.22L, 70% predicted FEV1/FVC ratio: 83% Interpretation: Spirometry consistent with possible restrictive disease.  Please see scanned spirometry results for details.  Medication List:  Current Outpatient Medications   Medication Sig Dispense Refill   albuterol (VENTOLIN HFA) 108 (90 Base) MCG/ACT inhaler Inhale 2 puffs into the lungs every 6 (six) hours as needed for wheezing or shortness of breath.     Azelastine HCl 137 MCG/SPRAY SOLN Place 2 sprays into the nose daily.     busPIRone (BUSPAR) 15 MG tablet Take 7.5 mg by mouth daily.     cetirizine (ZYRTEC) 10 MG tablet Take 10 mg by mouth daily.     EPINEPHrine 0.3 mg/0.3 mL IJ SOAJ injection Inject 0.3 mg into the muscle as needed. 1 each 2   escitalopram (LEXAPRO) 20 MG tablet Take 20 mg by mouth daily.     fluticasone-salmeterol (ADVAIR HFA) 230-21 MCG/ACT inhaler Inhale 2 puffs into the lungs 2 (two) times daily. with spacer and rinse mouth afterwards. 1 each 5   Olodaterol HCl 2.5 MCG/ACT AERS Inhale 2 puffs into the lungs daily.     omeprazole (PRILOSEC) 40 MG capsule Take 40 mg by mouth in the morning and at bedtime.     montelukast (SINGULAIR) 10 MG tablet Take 1 tablet (10 mg total) by mouth at bedtime. 30 tablet 5   No current facility-administered medications for this visit.   Allergies: No Known Allergies I reviewed his past medical history, social history, family history, and environmental history and no significant changes have been reported from his previous visit.  Review of Systems  Constitutional:  Negative for appetite change, chills, fever and unexpected weight change.  HENT:  Positive for congestion, postnasal drip and rhinorrhea.   Eyes:  Negative for itching.  Respiratory:  Negative for cough, chest tightness, shortness of breath and wheezing.   Cardiovascular:  Negative for chest pain.  Gastrointestinal:  Negative for abdominal pain.  Genitourinary:  Negative for difficulty urinating.  Skin:  Negative for rash.  Allergic/Immunologic: Positive for environmental allergies.  Neurological:  Negative for headaches.   Objective: BP 122/72   Pulse 66   Temp 97.8 F (36.6 C)   Resp 16   Ht 5\' 8"  (1.727 m)   Wt 292 lb 6.4 oz  (132.6 kg)   SpO2 96%   BMI 44.46 kg/m  Body mass index is 44.46 kg/m. Physical Exam Vitals and nursing note reviewed.  Constitutional:      Appearance: Normal appearance. He is well-developed.  HENT:     Head: Normocephalic and atraumatic.     Right Ear: Tympanic membrane and external ear normal.     Left Ear: Tympanic membrane and external ear normal.     Nose: Congestion and rhinorrhea present.     Comments: On right side    Mouth/Throat:     Mouth: Mucous membranes are moist.     Pharynx: Oropharynx is clear.  Eyes:     Conjunctiva/sclera: Conjunctivae normal.  Cardiovascular:     Rate and Rhythm: Normal rate and regular rhythm.     Heart sounds: Normal heart sounds. No murmur heard.   No friction rub. No gallop.  Pulmonary:     Effort: Pulmonary effort is normal.     Breath sounds: Normal breath sounds. No wheezing, rhonchi or rales.  Musculoskeletal:     Cervical back: Neck supple.  Skin:  General: Skin is warm.     Findings: No rash.  Neurological:     Mental Status: He is alert and oriented to person, place, and time.  Psychiatric:        Behavior: Behavior normal.  Previous notes and tests were reviewed. The plan was reviewed with the patient/family, and all questions/concerned were addressed.  It was my pleasure to see Bode today and participate in his care. Please feel free to contact me with any questions or concerns.  Sincerely,  Wyline Mood, DO Allergy & Immunology  [facility] office: [phone] [facility] office: [phone]

## 2021-07-27 NOTE — Assessment & Plan Note (Signed)
Past history - Diagnosed with asthma 18 years ago and had positive methacholine challenge test in 2016. Wears CPAP at night. Triggers include allergies. Breo not covered. Interim history - doing much better with Advair 2 puffs BID but still reaching for albuterol once a day.   Today's ACT score was 22.  . Today's spirometry showed: possible restrictive disease. Improved from previous one.   Daily controller medication(s): INCREASE Advair to 2 puffs twice a day with spacer and rinse mouth afterwards. . May use albuterol rescue inhaler 2 puffs every 4 to 6 hours as needed for shortness of breath, chest tightness, coughing, and wheezing. May use albuterol rescue inhaler 2 puffs 5 to 15 minutes prior to strenuous physical activities. Monitor frequency of use.  . Get spirometry at next visit.

## 2021-07-30 ENCOUNTER — Ambulatory Visit (INDEPENDENT_AMBULATORY_CARE_PROVIDER_SITE_OTHER): Payer: No Typology Code available for payment source

## 2021-07-30 DIAGNOSIS — J309 Allergic rhinitis, unspecified: Secondary | ICD-10-CM | POA: Diagnosis not present

## 2021-08-06 ENCOUNTER — Ambulatory Visit (INDEPENDENT_AMBULATORY_CARE_PROVIDER_SITE_OTHER): Payer: No Typology Code available for payment source | Admitting: *Deleted

## 2021-08-06 DIAGNOSIS — J309 Allergic rhinitis, unspecified: Secondary | ICD-10-CM | POA: Diagnosis not present

## 2021-08-14 ENCOUNTER — Ambulatory Visit (INDEPENDENT_AMBULATORY_CARE_PROVIDER_SITE_OTHER): Payer: No Typology Code available for payment source

## 2021-08-14 DIAGNOSIS — J309 Allergic rhinitis, unspecified: Secondary | ICD-10-CM | POA: Diagnosis not present

## 2021-08-21 ENCOUNTER — Ambulatory Visit (INDEPENDENT_AMBULATORY_CARE_PROVIDER_SITE_OTHER): Payer: No Typology Code available for payment source | Admitting: *Deleted

## 2021-08-21 DIAGNOSIS — J309 Allergic rhinitis, unspecified: Secondary | ICD-10-CM | POA: Diagnosis not present

## 2021-08-28 ENCOUNTER — Ambulatory Visit (INDEPENDENT_AMBULATORY_CARE_PROVIDER_SITE_OTHER): Payer: No Typology Code available for payment source

## 2021-08-28 DIAGNOSIS — J309 Allergic rhinitis, unspecified: Secondary | ICD-10-CM

## 2021-09-03 ENCOUNTER — Telehealth: Payer: Self-pay | Admitting: Allergy

## 2021-09-03 NOTE — Telephone Encounter (Signed)
Renewal of authorization request has been faxed to [facility], [phone] 

## 2021-09-10 ENCOUNTER — Ambulatory Visit (INDEPENDENT_AMBULATORY_CARE_PROVIDER_SITE_OTHER): Payer: No Typology Code available for payment source | Admitting: *Deleted

## 2021-09-10 DIAGNOSIS — J309 Allergic rhinitis, unspecified: Secondary | ICD-10-CM

## 2021-09-15 DIAGNOSIS — J3089 Other allergic rhinitis: Secondary | ICD-10-CM | POA: Diagnosis not present

## 2021-09-15 NOTE — Progress Notes (Signed)
VIALS MADE. EXP 09-15-22 

## 2021-09-16 DIAGNOSIS — J3081 Allergic rhinitis due to animal (cat) (dog) hair and dander: Secondary | ICD-10-CM | POA: Diagnosis not present

## 2021-09-18 ENCOUNTER — Ambulatory Visit (INDEPENDENT_AMBULATORY_CARE_PROVIDER_SITE_OTHER): Payer: No Typology Code available for payment source

## 2021-09-18 DIAGNOSIS — J309 Allergic rhinitis, unspecified: Secondary | ICD-10-CM | POA: Diagnosis not present

## 2021-09-18 NOTE — Telephone Encounter (Signed)
Contacted [facility] to check on authorization request. Spoke to Maureen Ralphs, stated authorization had not been received. I have faxed [phone]) over the request today (09/18/2021)  and emailed it to [email or web address].  Will follow up on Monday afternoon to make sure it was received.

## 2021-09-22 NOTE — Telephone Encounter (Signed)
[facility], they have received patient's request and it will be authorized and sent to [place].

## 2021-09-28 ENCOUNTER — Ambulatory Visit (INDEPENDENT_AMBULATORY_CARE_PROVIDER_SITE_OTHER): Payer: No Typology Code available for payment source

## 2021-09-28 DIAGNOSIS — J309 Allergic rhinitis, unspecified: Secondary | ICD-10-CM

## 2021-09-30 NOTE — Telephone Encounter (Signed)
Called [facility], spoke to [employer] T. Authorization has been approved for 09-22-2021 to 09-28-2022, [id number]. Pam is going to fax over authorization today.

## 2021-10-14 NOTE — Telephone Encounter (Signed)
Spoke to [employer]. At the [facility], needed authorization backdated to cover injection from 09-18-2021. Durenda Hurt instructed me to send current authorization and documentation of injection to Nurse Bridgett & Nurse Doylene Canning so authorization may be backdated.   I have emailed this to both Nurses ([email or web address] & [email or web address]) as well as faxed it over, [phone]

## 2021-10-15 NOTE — Telephone Encounter (Signed)
Received an email from Ermalene Searing, RN authorization [phone]) has been backdated for dates of 09-18-2021 to 10-18-2022. I have updated referral.

## 2021-10-16 ENCOUNTER — Ambulatory Visit (INDEPENDENT_AMBULATORY_CARE_PROVIDER_SITE_OTHER): Payer: No Typology Code available for payment source

## 2021-10-16 DIAGNOSIS — J309 Allergic rhinitis, unspecified: Secondary | ICD-10-CM | POA: Diagnosis not present

## 2021-10-27 ENCOUNTER — Ambulatory Visit (INDEPENDENT_AMBULATORY_CARE_PROVIDER_SITE_OTHER): Payer: No Typology Code available for payment source | Admitting: *Deleted

## 2021-10-27 DIAGNOSIS — J309 Allergic rhinitis, unspecified: Secondary | ICD-10-CM

## 2021-11-04 ENCOUNTER — Ambulatory Visit (INDEPENDENT_AMBULATORY_CARE_PROVIDER_SITE_OTHER): Payer: No Typology Code available for payment source

## 2021-11-04 DIAGNOSIS — J309 Allergic rhinitis, unspecified: Secondary | ICD-10-CM | POA: Diagnosis not present

## 2021-11-13 ENCOUNTER — Ambulatory Visit (INDEPENDENT_AMBULATORY_CARE_PROVIDER_SITE_OTHER): Payer: No Typology Code available for payment source

## 2021-11-13 DIAGNOSIS — J309 Allergic rhinitis, unspecified: Secondary | ICD-10-CM

## 2021-11-27 ENCOUNTER — Ambulatory Visit (INDEPENDENT_AMBULATORY_CARE_PROVIDER_SITE_OTHER): Payer: No Typology Code available for payment source

## 2021-11-27 DIAGNOSIS — J309 Allergic rhinitis, unspecified: Secondary | ICD-10-CM

## 2021-12-11 ENCOUNTER — Ambulatory Visit (INDEPENDENT_AMBULATORY_CARE_PROVIDER_SITE_OTHER): Payer: No Typology Code available for payment source

## 2021-12-11 DIAGNOSIS — J309 Allergic rhinitis, unspecified: Secondary | ICD-10-CM | POA: Diagnosis not present

## 2022-01-01 ENCOUNTER — Ambulatory Visit (INDEPENDENT_AMBULATORY_CARE_PROVIDER_SITE_OTHER): Payer: No Typology Code available for payment source

## 2022-01-01 DIAGNOSIS — J309 Allergic rhinitis, unspecified: Secondary | ICD-10-CM

## 2022-01-08 ENCOUNTER — Ambulatory Visit (INDEPENDENT_AMBULATORY_CARE_PROVIDER_SITE_OTHER): Payer: No Typology Code available for payment source

## 2022-01-08 DIAGNOSIS — J309 Allergic rhinitis, unspecified: Secondary | ICD-10-CM

## 2022-01-15 ENCOUNTER — Ambulatory Visit (INDEPENDENT_AMBULATORY_CARE_PROVIDER_SITE_OTHER): Payer: No Typology Code available for payment source

## 2022-01-15 DIAGNOSIS — J309 Allergic rhinitis, unspecified: Secondary | ICD-10-CM

## 2022-01-22 ENCOUNTER — Ambulatory Visit (INDEPENDENT_AMBULATORY_CARE_PROVIDER_SITE_OTHER): Payer: No Typology Code available for payment source

## 2022-01-22 DIAGNOSIS — J309 Allergic rhinitis, unspecified: Secondary | ICD-10-CM

## 2022-01-29 ENCOUNTER — Ambulatory Visit (INDEPENDENT_AMBULATORY_CARE_PROVIDER_SITE_OTHER): Payer: No Typology Code available for payment source | Admitting: *Deleted

## 2022-01-29 DIAGNOSIS — J309 Allergic rhinitis, unspecified: Secondary | ICD-10-CM

## 2022-02-04 ENCOUNTER — Ambulatory Visit (INDEPENDENT_AMBULATORY_CARE_PROVIDER_SITE_OTHER): Payer: No Typology Code available for payment source

## 2022-02-04 DIAGNOSIS — J309 Allergic rhinitis, unspecified: Secondary | ICD-10-CM | POA: Diagnosis not present

## 2022-02-12 ENCOUNTER — Ambulatory Visit (INDEPENDENT_AMBULATORY_CARE_PROVIDER_SITE_OTHER): Payer: No Typology Code available for payment source

## 2022-02-12 DIAGNOSIS — J309 Allergic rhinitis, unspecified: Secondary | ICD-10-CM

## 2022-02-26 ENCOUNTER — Ambulatory Visit (INDEPENDENT_AMBULATORY_CARE_PROVIDER_SITE_OTHER): Payer: No Typology Code available for payment source

## 2022-02-26 DIAGNOSIS — J309 Allergic rhinitis, unspecified: Secondary | ICD-10-CM

## 2022-03-02 DIAGNOSIS — J3089 Other allergic rhinitis: Secondary | ICD-10-CM | POA: Diagnosis not present

## 2022-03-02 NOTE — Progress Notes (Signed)
VIALS EXP 03-03-23 ?

## 2022-03-03 DIAGNOSIS — J3081 Allergic rhinitis due to animal (cat) (dog) hair and dander: Secondary | ICD-10-CM | POA: Diagnosis not present

## 2022-03-19 ENCOUNTER — Ambulatory Visit (INDEPENDENT_AMBULATORY_CARE_PROVIDER_SITE_OTHER): Payer: No Typology Code available for payment source

## 2022-03-19 DIAGNOSIS — J309 Allergic rhinitis, unspecified: Secondary | ICD-10-CM | POA: Diagnosis not present

## 2022-03-26 ENCOUNTER — Ambulatory Visit (INDEPENDENT_AMBULATORY_CARE_PROVIDER_SITE_OTHER): Payer: No Typology Code available for payment source

## 2022-03-26 DIAGNOSIS — J309 Allergic rhinitis, unspecified: Secondary | ICD-10-CM

## 2022-04-08 ENCOUNTER — Ambulatory Visit (INDEPENDENT_AMBULATORY_CARE_PROVIDER_SITE_OTHER): Payer: No Typology Code available for payment source

## 2022-04-08 DIAGNOSIS — J309 Allergic rhinitis, unspecified: Secondary | ICD-10-CM

## 2022-04-23 ENCOUNTER — Ambulatory Visit (INDEPENDENT_AMBULATORY_CARE_PROVIDER_SITE_OTHER): Payer: No Typology Code available for payment source

## 2022-04-23 DIAGNOSIS — J309 Allergic rhinitis, unspecified: Secondary | ICD-10-CM | POA: Diagnosis not present

## 2022-05-19 ENCOUNTER — Ambulatory Visit (INDEPENDENT_AMBULATORY_CARE_PROVIDER_SITE_OTHER): Payer: No Typology Code available for payment source

## 2022-05-19 DIAGNOSIS — J309 Allergic rhinitis, unspecified: Secondary | ICD-10-CM

## 2022-05-27 ENCOUNTER — Ambulatory Visit (INDEPENDENT_AMBULATORY_CARE_PROVIDER_SITE_OTHER): Payer: No Typology Code available for payment source

## 2022-05-27 DIAGNOSIS — J309 Allergic rhinitis, unspecified: Secondary | ICD-10-CM | POA: Diagnosis not present

## 2022-06-01 ENCOUNTER — Ambulatory Visit (INDEPENDENT_AMBULATORY_CARE_PROVIDER_SITE_OTHER): Payer: No Typology Code available for payment source

## 2022-06-01 DIAGNOSIS — J309 Allergic rhinitis, unspecified: Secondary | ICD-10-CM | POA: Diagnosis not present

## 2022-06-17 ENCOUNTER — Ambulatory Visit (INDEPENDENT_AMBULATORY_CARE_PROVIDER_SITE_OTHER): Payer: No Typology Code available for payment source

## 2022-06-17 DIAGNOSIS — J309 Allergic rhinitis, unspecified: Secondary | ICD-10-CM | POA: Diagnosis not present

## 2022-06-24 ENCOUNTER — Ambulatory Visit (INDEPENDENT_AMBULATORY_CARE_PROVIDER_SITE_OTHER): Payer: No Typology Code available for payment source

## 2022-06-24 DIAGNOSIS — J309 Allergic rhinitis, unspecified: Secondary | ICD-10-CM

## 2022-07-09 ENCOUNTER — Ambulatory Visit (INDEPENDENT_AMBULATORY_CARE_PROVIDER_SITE_OTHER): Payer: No Typology Code available for payment source

## 2022-07-09 DIAGNOSIS — J309 Allergic rhinitis, unspecified: Secondary | ICD-10-CM | POA: Diagnosis not present

## 2022-07-30 ENCOUNTER — Ambulatory Visit (INDEPENDENT_AMBULATORY_CARE_PROVIDER_SITE_OTHER): Payer: No Typology Code available for payment source

## 2022-07-30 DIAGNOSIS — J309 Allergic rhinitis, unspecified: Secondary | ICD-10-CM | POA: Diagnosis not present

## 2022-08-05 DIAGNOSIS — J3089 Other allergic rhinitis: Secondary | ICD-10-CM | POA: Diagnosis not present

## 2022-08-05 NOTE — Progress Notes (Signed)
VIALS EXP 08-06-23 

## 2022-08-06 DIAGNOSIS — J3081 Allergic rhinitis due to animal (cat) (dog) hair and dander: Secondary | ICD-10-CM

## 2022-08-13 ENCOUNTER — Ambulatory Visit (INDEPENDENT_AMBULATORY_CARE_PROVIDER_SITE_OTHER): Payer: No Typology Code available for payment source

## 2022-08-13 DIAGNOSIS — J309 Allergic rhinitis, unspecified: Secondary | ICD-10-CM | POA: Diagnosis not present

## 2022-09-03 ENCOUNTER — Ambulatory Visit (INDEPENDENT_AMBULATORY_CARE_PROVIDER_SITE_OTHER): Payer: No Typology Code available for payment source | Admitting: *Deleted

## 2022-09-03 DIAGNOSIS — J309 Allergic rhinitis, unspecified: Secondary | ICD-10-CM

## 2022-09-24 ENCOUNTER — Ambulatory Visit (INDEPENDENT_AMBULATORY_CARE_PROVIDER_SITE_OTHER): Payer: No Typology Code available for payment source

## 2022-09-24 DIAGNOSIS — J309 Allergic rhinitis, unspecified: Secondary | ICD-10-CM

## 2022-10-08 ENCOUNTER — Ambulatory Visit (INDEPENDENT_AMBULATORY_CARE_PROVIDER_SITE_OTHER): Payer: No Typology Code available for payment source

## 2022-10-08 ENCOUNTER — Telehealth: Payer: Self-pay | Admitting: Allergy

## 2022-10-08 DIAGNOSIS — J309 Allergic rhinitis, unspecified: Secondary | ICD-10-CM | POA: Diagnosis not present

## 2022-10-08 NOTE — Telephone Encounter (Signed)
Faxed renewal of authorization request to [facility], [phone] and emailed it to [email or web address].  Called and left detailed message advising patient to reach out to [place] as well and to call our office if he has any questions.

## 2022-10-15 ENCOUNTER — Ambulatory Visit (INDEPENDENT_AMBULATORY_CARE_PROVIDER_SITE_OTHER): Payer: No Typology Code available for payment source

## 2022-10-15 DIAGNOSIS — J309 Allergic rhinitis, unspecified: Secondary | ICD-10-CM | POA: Diagnosis not present

## 2022-10-22 ENCOUNTER — Ambulatory Visit (INDEPENDENT_AMBULATORY_CARE_PROVIDER_SITE_OTHER): Payer: No Typology Code available for payment source

## 2022-10-22 DIAGNOSIS — J309 Allergic rhinitis, unspecified: Secondary | ICD-10-CM

## 2022-11-11 NOTE — Telephone Encounter (Signed)
Reached out to [facility], [phone]  Was told that request had not been received and was advised to resend it. I have re faxed request to [phone] and emailed it again to [email or web address].

## 2022-11-12 ENCOUNTER — Ambulatory Visit (INDEPENDENT_AMBULATORY_CARE_PROVIDER_SITE_OTHER): Payer: No Typology Code available for payment source

## 2022-11-12 DIAGNOSIS — J309 Allergic rhinitis, unspecified: Secondary | ICD-10-CM

## 2022-11-17 ENCOUNTER — Ambulatory Visit (INDEPENDENT_AMBULATORY_CARE_PROVIDER_SITE_OTHER): Payer: No Typology Code available for payment source | Admitting: *Deleted

## 2022-11-17 DIAGNOSIS — J309 Allergic rhinitis, unspecified: Secondary | ICD-10-CM

## 2022-12-03 ENCOUNTER — Ambulatory Visit (INDEPENDENT_AMBULATORY_CARE_PROVIDER_SITE_OTHER): Payer: No Typology Code available for payment source | Admitting: *Deleted

## 2022-12-03 DIAGNOSIS — J309 Allergic rhinitis, unspecified: Secondary | ICD-10-CM

## 2022-12-09 NOTE — Telephone Encounter (Signed)
Received new authorization with preliminary dates of 12-09-2022 to 12-09-2023. Reached out to [employer]. [facility] Contact), [phone] - listed in authorization - advised Sheryle Hail I had sent request in Oct 2023.   Patient's last authorization expired 10-18-2022 and he has had four injections since then that would need to be covered with new authorization, the first one being on 10-22-2022. Requested for new authorization to be backdated to 10-22-2022 so injections would be covered. Sheryle Hail stated she would have that corrected and have corrected authorization sent to our office.

## 2022-12-14 NOTE — Telephone Encounter (Signed)
Received updated authorization. Have updated it in system.

## 2022-12-28 ENCOUNTER — Ambulatory Visit (INDEPENDENT_AMBULATORY_CARE_PROVIDER_SITE_OTHER): Payer: No Typology Code available for payment source

## 2022-12-28 DIAGNOSIS — J309 Allergic rhinitis, unspecified: Secondary | ICD-10-CM

## 2023-02-11 ENCOUNTER — Ambulatory Visit (INDEPENDENT_AMBULATORY_CARE_PROVIDER_SITE_OTHER): Payer: No Typology Code available for payment source | Admitting: *Deleted

## 2023-02-11 DIAGNOSIS — J309 Allergic rhinitis, unspecified: Secondary | ICD-10-CM

## 2023-02-25 ENCOUNTER — Ambulatory Visit (INDEPENDENT_AMBULATORY_CARE_PROVIDER_SITE_OTHER): Payer: No Typology Code available for payment source | Admitting: *Deleted

## 2023-02-25 DIAGNOSIS — J309 Allergic rhinitis, unspecified: Secondary | ICD-10-CM

## 2023-03-04 ENCOUNTER — Ambulatory Visit (INDEPENDENT_AMBULATORY_CARE_PROVIDER_SITE_OTHER): Payer: No Typology Code available for payment source | Admitting: *Deleted

## 2023-03-04 DIAGNOSIS — J309 Allergic rhinitis, unspecified: Secondary | ICD-10-CM | POA: Diagnosis not present

## 2023-03-11 ENCOUNTER — Ambulatory Visit (INDEPENDENT_AMBULATORY_CARE_PROVIDER_SITE_OTHER): Payer: No Typology Code available for payment source

## 2023-03-11 DIAGNOSIS — J309 Allergic rhinitis, unspecified: Secondary | ICD-10-CM

## 2023-03-17 ENCOUNTER — Ambulatory Visit (INDEPENDENT_AMBULATORY_CARE_PROVIDER_SITE_OTHER): Payer: No Typology Code available for payment source

## 2023-03-17 DIAGNOSIS — J309 Allergic rhinitis, unspecified: Secondary | ICD-10-CM | POA: Diagnosis not present

## 2023-03-23 ENCOUNTER — Ambulatory Visit (INDEPENDENT_AMBULATORY_CARE_PROVIDER_SITE_OTHER): Payer: No Typology Code available for payment source

## 2023-03-23 DIAGNOSIS — J309 Allergic rhinitis, unspecified: Secondary | ICD-10-CM

## 2023-03-29 NOTE — Progress Notes (Signed)
VIALS EXP 03-28-24

## 2023-03-30 DIAGNOSIS — J3089 Other allergic rhinitis: Secondary | ICD-10-CM | POA: Diagnosis not present

## 2023-03-31 DIAGNOSIS — J3081 Allergic rhinitis due to animal (cat) (dog) hair and dander: Secondary | ICD-10-CM | POA: Diagnosis not present

## 2023-04-01 ENCOUNTER — Ambulatory Visit (INDEPENDENT_AMBULATORY_CARE_PROVIDER_SITE_OTHER): Payer: No Typology Code available for payment source

## 2023-04-01 DIAGNOSIS — J309 Allergic rhinitis, unspecified: Secondary | ICD-10-CM

## 2023-04-15 ENCOUNTER — Ambulatory Visit (INDEPENDENT_AMBULATORY_CARE_PROVIDER_SITE_OTHER): Payer: No Typology Code available for payment source

## 2023-04-15 DIAGNOSIS — J309 Allergic rhinitis, unspecified: Secondary | ICD-10-CM | POA: Diagnosis not present

## 2023-09-28 ENCOUNTER — Telehealth: Payer: Self-pay | Admitting: Allergy

## 2023-09-28 NOTE — Telephone Encounter (Signed)
Patient's authorization, [id number] expires 10-22-2023. Patient has not been seen in office for a visit since 07/2021 and has not come in for an injection since 03/2023  Called and left message for patient to call back.   Would like to know if patient would like to continue care at our practice before sending renewal of services request. Patient has not been consistent with office visits and has not come in for an injection in about 6 months.
